# Patient Record
Sex: Female | Born: 1955 | Race: White | Hispanic: No | Marital: Married | Smoking: Never smoker
Health system: Southern US, Community
[De-identification: ages and names within clinical notes are randomized; demographics above are authoritative.]

## PROBLEM LIST (undated history)

## (undated) DIAGNOSIS — R87619 Unspecified abnormal cytological findings in specimens from cervix uteri: Secondary | ICD-10-CM

## (undated) DIAGNOSIS — D219 Benign neoplasm of connective and other soft tissue, unspecified: Secondary | ICD-10-CM

## (undated) DIAGNOSIS — F32A Depression, unspecified: Secondary | ICD-10-CM

## (undated) DIAGNOSIS — S5291XA Unspecified fracture of right forearm, initial encounter for closed fracture: Secondary | ICD-10-CM

## (undated) DIAGNOSIS — F419 Anxiety disorder, unspecified: Secondary | ICD-10-CM

## (undated) DIAGNOSIS — M8580 Other specified disorders of bone density and structure, unspecified site: Secondary | ICD-10-CM

## (undated) DIAGNOSIS — B029 Zoster without complications: Secondary | ICD-10-CM

## (undated) DIAGNOSIS — F329 Major depressive disorder, single episode, unspecified: Secondary | ICD-10-CM

## (undated) HISTORY — DX: Major depressive disorder, single episode, unspecified: F32.9

## (undated) HISTORY — DX: Benign neoplasm of connective and other soft tissue, unspecified: D21.9

## (undated) HISTORY — PX: ENDOMETRIAL BIOPSY: SHX622

## (undated) HISTORY — DX: Zoster without complications: B02.9

## (undated) HISTORY — DX: Other specified disorders of bone density and structure, unspecified site: M85.80

## (undated) HISTORY — DX: Unspecified abnormal cytological findings in specimens from cervix uteri: R87.619

## (undated) HISTORY — DX: Depression, unspecified: F32.A

## (undated) HISTORY — DX: Unspecified fracture of right forearm, initial encounter for closed fracture: S52.91XA

## (undated) HISTORY — DX: Anxiety disorder, unspecified: F41.9

---
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
## 1991-06-05 HISTORY — PX: TUBAL LIGATION: SHX77

## 2001-08-07 ENCOUNTER — Encounter: Payer: Self-pay | Admitting: Unknown Physician Specialty

## 2001-08-07 ENCOUNTER — Ambulatory Visit (HOSPITAL_COMMUNITY): Admission: RE | Admit: 2001-08-07 | Discharge: 2001-08-07 | Payer: Self-pay | Admitting: Unknown Physician Specialty

## 2002-08-20 ENCOUNTER — Encounter: Payer: Self-pay | Admitting: Unknown Physician Specialty

## 2002-08-20 ENCOUNTER — Ambulatory Visit (HOSPITAL_COMMUNITY): Admission: RE | Admit: 2002-08-20 | Discharge: 2002-08-20 | Payer: Self-pay | Admitting: Unknown Physician Specialty

## 2004-07-28 ENCOUNTER — Ambulatory Visit (HOSPITAL_COMMUNITY): Admission: RE | Admit: 2004-07-28 | Discharge: 2004-07-28 | Payer: Self-pay | Admitting: Unknown Physician Specialty

## 2006-07-11 ENCOUNTER — Ambulatory Visit (HOSPITAL_COMMUNITY): Admission: RE | Admit: 2006-07-11 | Discharge: 2006-07-11 | Payer: Self-pay | Admitting: Pediatrics

## 2007-07-10 ENCOUNTER — Other Ambulatory Visit: Admission: RE | Admit: 2007-07-10 | Discharge: 2007-07-10 | Payer: Self-pay | Admitting: Obstetrics and Gynecology

## 2008-07-16 ENCOUNTER — Other Ambulatory Visit: Admission: RE | Admit: 2008-07-16 | Discharge: 2008-07-16 | Payer: Self-pay | Admitting: Obstetrics and Gynecology

## 2008-08-04 ENCOUNTER — Ambulatory Visit (HOSPITAL_COMMUNITY): Admission: RE | Admit: 2008-08-04 | Discharge: 2008-08-04 | Payer: Self-pay | Admitting: Obstetrics and Gynecology

## 2008-10-11 LAB — HM DEXA SCAN

## 2010-08-25 ENCOUNTER — Other Ambulatory Visit: Payer: Self-pay | Admitting: Certified Nurse Midwife

## 2010-08-25 DIAGNOSIS — Z139 Encounter for screening, unspecified: Secondary | ICD-10-CM

## 2010-08-31 ENCOUNTER — Ambulatory Visit (HOSPITAL_COMMUNITY)
Admission: RE | Admit: 2010-08-31 | Discharge: 2010-08-31 | Disposition: A | Discharge status: Home / Self Care | Payer: BC Managed Care – PPO | Source: Ambulatory Visit | Attending: Certified Nurse Midwife | Admitting: Certified Nurse Midwife

## 2010-08-31 DIAGNOSIS — Z139 Encounter for screening, unspecified: Secondary | ICD-10-CM

## 2010-08-31 DIAGNOSIS — Z1231 Encounter for screening mammogram for malignant neoplasm of breast: Secondary | ICD-10-CM | POA: Insufficient documentation

## 2011-09-04 LAB — HM PAP SMEAR: HM Pap smear: NEGATIVE

## 2011-09-10 ENCOUNTER — Other Ambulatory Visit: Payer: Self-pay | Admitting: Certified Nurse Midwife

## 2011-09-10 DIAGNOSIS — Z139 Encounter for screening, unspecified: Secondary | ICD-10-CM

## 2011-09-13 ENCOUNTER — Ambulatory Visit (HOSPITAL_COMMUNITY): Payer: BC Managed Care – PPO

## 2011-09-17 ENCOUNTER — Ambulatory Visit (HOSPITAL_COMMUNITY)
Admission: RE | Admit: 2011-09-17 | Discharge: 2011-09-17 | Disposition: A | Discharge status: Home / Self Care | Payer: BC Managed Care – PPO | Source: Ambulatory Visit | Attending: Certified Nurse Midwife | Admitting: Certified Nurse Midwife

## 2011-09-17 DIAGNOSIS — Z1231 Encounter for screening mammogram for malignant neoplasm of breast: Secondary | ICD-10-CM | POA: Insufficient documentation

## 2011-09-17 DIAGNOSIS — Z139 Encounter for screening, unspecified: Secondary | ICD-10-CM

## 2012-09-24 ENCOUNTER — Encounter: Payer: Self-pay | Admitting: *Deleted

## 2012-09-25 ENCOUNTER — Ambulatory Visit: Payer: Self-pay | Admitting: Certified Nurse Midwife

## 2012-10-03 ENCOUNTER — Ambulatory Visit (INDEPENDENT_AMBULATORY_CARE_PROVIDER_SITE_OTHER): Payer: BC Managed Care – PPO | Admitting: Certified Nurse Midwife

## 2012-10-03 ENCOUNTER — Encounter: Payer: Self-pay | Admitting: Certified Nurse Midwife

## 2012-10-03 VITALS — BP 102/62 | Ht 63.0 in | Wt 114.0 lb

## 2012-10-03 DIAGNOSIS — Z01419 Encounter for gynecological examination (general) (routine) without abnormal findings: Secondary | ICD-10-CM

## 2012-10-03 DIAGNOSIS — Z0000 Encounter for general adult medical examination without abnormal findings: Secondary | ICD-10-CM

## 2012-10-03 LAB — POCT URINALYSIS DIPSTICK
Bilirubin, UA: NEGATIVE
Glucose, UA: NEGATIVE
Leukocytes, UA: NEGATIVE
Nitrite, UA: NEGATIVE

## 2012-10-03 NOTE — Progress Notes (Signed)
57 y.o. G3P3 Married Caucasian Fe here for annual exam.  Menopausal no vaginal bleeding. Had endometrial biopsy for PMB 4-13, benign.  Staying active, had labs with PCP , all normal except cholesterol borderline high, working on with diet and exercise.  Occasional vaginal dryness with sexual activity using OTC lubricant.  No health issues today.   Patient's last menstrual period was 09/19/2011.          Sexually active: yes  The current method of family planning is tubal ligation.    Exercising: yes  walking, weights, crunches Smoker:  no  Health Maintenance: Pap:  09-04-11 neg pap MMG:  09-17-11 neg Colonoscopy:  3/11 BMD: 15 yrs ago, 5/10 heel scan TDaP:  2004 Labs: Poct urine-neg Self breast exam: occ   reports that she has never smoked. She does not have any smokeless tobacco history on file. She reports that she does not drink alcohol or use illicit drugs.  Past Medical History  Diagnosis Date  . Fibroid     Past Surgical History  Procedure Laterality Date  . Tubal ligation  1993    BTL  . Cesarean section  1993  . Tonsillectomy  1963    Current Outpatient Prescriptions  Medication Sig Dispense Refill  . aspirin 81 MG tablet Take 81 mg by mouth daily.      . Cholecalciferol (VITAMIN D PO) Take 50,000 Units by mouth every 14 (fourteen) days.      . Red Yeast Rice Extract (RED YEAST RICE PO) Take 1,200 mg by mouth daily.       No current facility-administered medications for this visit.    History reviewed. No pertinent family history.  ROS:  Pertinent items are noted in HPI.  Otherwise, a comprehensive ROS was negative.  Exam:   BP 102/62  Ht 5\' 3"  (1.6 m)  Wt 114 lb (51.71 kg)  BMI 20.2 kg/m2  LMP 09/19/2011 Height: 5\' 3"  (160 cm)  Ht Readings from Last 3 Encounters:  10/03/12 5\' 3"  (1.6 m)    General appearance: alert, cooperative and appears stated age Head: Normocephalic, without obvious abnormality, atraumatic Neck: no adenopathy, supple, symmetrical,  trachea midline and thyroid normal to inspection and palpation Lungs: clear to auscultation bilaterally Breasts: normal appearance, no masses or tenderness, No nipple discharge or bleeding Heart: regular rate and rhythm Abdomen: soft, non-tender; no masses,  no organomegaly Extremities: extremities normal, atraumatic, no cyanosis or edema Skin: Skin color, texture, turgor normal. No rashes or lesions Lymph nodes: Cervical, supraclavicular, and axillary nodes normal. No abnormal inguinal nodes palpated Neurologic: Grossly normal   Pelvic: External genitalia:  no lesions              Urethra:  normal appearing urethra with no masses, tenderness or lesions              Bartholin's and Skene's: normal                 Vagina: normal appearing vagina with normal color and discharge, no lesions              Cervix: normal, non tender              Pap taken: yes HPVHR Bimanual Exam:  Uterus:  normal size, contour, position, consistency, mobility, non-tender and anteverted              Adnexa: normal adnexa and no mass, fullness, tenderness  Rectovaginal: Confirms               Anus:  normal sphincter tone, no lesions  A:  Well Woman with normal exam  Menopausal, no HRT  Vaginal Dryness using OTC moisture product   P:   Pap smear as per guidelines   Mammogram yearly  Advise if problems with vaginal dryness or has any  Vaginal bleeding.  RV annually, prn          An After Visit Summary was printed and given to the patient.  Reviewed, TL

## 2012-10-03 NOTE — Patient Instructions (Signed)

## 2012-10-06 ENCOUNTER — Other Ambulatory Visit: Payer: Self-pay | Admitting: Certified Nurse Midwife

## 2012-10-06 DIAGNOSIS — Z139 Encounter for screening, unspecified: Secondary | ICD-10-CM

## 2012-10-07 LAB — IPS PAP TEST WITH HPV

## 2012-10-09 ENCOUNTER — Ambulatory Visit (HOSPITAL_COMMUNITY): Payer: BC Managed Care – PPO

## 2012-10-13 ENCOUNTER — Ambulatory Visit (HOSPITAL_COMMUNITY)
Admission: RE | Admit: 2012-10-13 | Discharge: 2012-10-13 | Disposition: A | Discharge status: Home / Self Care | Payer: BC Managed Care – PPO | Source: Ambulatory Visit | Attending: Certified Nurse Midwife | Admitting: Certified Nurse Midwife

## 2012-10-13 DIAGNOSIS — Z139 Encounter for screening, unspecified: Secondary | ICD-10-CM

## 2012-10-13 DIAGNOSIS — Z1231 Encounter for screening mammogram for malignant neoplasm of breast: Secondary | ICD-10-CM | POA: Insufficient documentation

## 2012-10-22 ENCOUNTER — Telehealth: Payer: Self-pay | Admitting: Certified Nurse Midwife

## 2012-10-22 NOTE — Telephone Encounter (Signed)
Patient was told to get Vitamin D by Ander Slade, and wants to speak specifically with Joy in reference to further information regarding intake.

## 2012-10-22 NOTE — Telephone Encounter (Signed)
Patient couldn't remember whether dl told her to take vit d2 1000 or d3 1000. Patient has vitamin d2. Pt will take that daily after she finishes the vitamin d 50,000iu 1 time a month  Routed to dl

## 2012-10-22 NOTE — Telephone Encounter (Signed)
Pt stated depending on what dosage she finds will determine whether it will be d2 or d3. She already bought the d2

## 2012-10-22 NOTE — Telephone Encounter (Signed)
I dont see where vitamin d labs where done so not sure what it is in reference to. Left message for patient to callback

## 2012-10-22 NOTE — Telephone Encounter (Signed)
Patient returning Joy's call.  °

## 2012-10-22 NOTE — Telephone Encounter (Signed)
Should use D3

## 2012-12-25 ENCOUNTER — Telehealth: Payer: Self-pay | Admitting: Certified Nurse Midwife

## 2012-12-25 NOTE — Telephone Encounter (Signed)
Patient is having some "strange health concerns". Patient would like an appointment with Lovett Sox.

## 2012-12-25 NOTE — Telephone Encounter (Signed)
Last AEX 10/06/2012 with Ortencia Kick. Patient calling today with concerns of medication she has taken for peripheral neuropathy x 2-3 days that caused weakness, nausea symptoms.  States she took L-Carnitine 1000mg  2 x day and B complex ,B50, 3 x day states this was to help with peripheral neuropathy. Saw her PCP yesterday for the symptoms and when he looked up the L-Carnitine stated those were side effects to it. Due to this it has caused her panic and fear and anxiety. Stated he Rx xanax 5mg  to take. States for short term use. She took only 0.25mg  this morning and when she call she has not felt it has helped. Calling to ask if she could use Citalopram she had used one time before for anxiety. Patient aware D. Darcel Bayley out of town and state would wait to hear back from her. appointment given to patient with D. Leonard,CNM, for wed. July 30th @ 10:30am

## 2012-12-31 ENCOUNTER — Ambulatory Visit (INDEPENDENT_AMBULATORY_CARE_PROVIDER_SITE_OTHER): Payer: BC Managed Care – PPO | Admitting: Certified Nurse Midwife

## 2012-12-31 ENCOUNTER — Encounter: Payer: Self-pay | Admitting: Certified Nurse Midwife

## 2012-12-31 VITALS — BP 108/60 | HR 60 | Resp 16 | Ht 63.0 in | Wt 110.0 lb

## 2012-12-31 DIAGNOSIS — F329 Major depressive disorder, single episode, unspecified: Secondary | ICD-10-CM

## 2012-12-31 DIAGNOSIS — F411 Generalized anxiety disorder: Secondary | ICD-10-CM

## 2012-12-31 MED ORDER — ESCITALOPRAM OXALATE 10 MG PO TABS: 10.0000 mg | ORAL_TABLET | Freq: Every day | ORAL | Status: DC

## 2012-12-31 NOTE — Progress Notes (Signed)
57 y.o.MarriedCaucasianfemale presents with symptoms of anxiety, depression.  Pt reports symptoms have been for 8 of weeks.  The symptoms have moderately affected her activities of daily living.  She reports restless sleep, worry of chronic pain with neuropathy in feet(undiagnosed, has an appointment with foot specialist), depression of not being able to go on with her usual routine. Some crying episodes, no thoughts of self harm or others. Family concerned and with her today(daughter, sister in law).Patient had tried OTC product recommended to her and had increase change in her feet. Patient saw PCP for lab work all normal. Patient given Xanax for panic attacks, but doesn't want to use. Took 2 (.25 mg) and noted some change in anxious feeling.  Has been on anti anxiety medication Celexa, but didn't like how she felt, so she stopped.  Patient would like advice concerning plan for her to "feel better" . Patient 3 years out from menopause, no hot flashes or night sweats. Eats well.   ROS: Pertinent items are noted in HPI.  Physical exam:  General appearance: alert, cooperative and appears stated age, Healthy WDWN Affect: anxious, orientation X 3  A:Anxiety/depression exacerbated by feet concern 2-Tingling of feet, no exam or work up, has appointment for.   Plan:  Discussed with patient the pros and cons of medication use on daily basis vs interim with Xanax. Patient would like trial.  Rx Lexapro 10 mg see order, instructions given Only use Xanax for panic attack only. Patient agreeable. Discussed counseling would also help with coping with changes she faces daily. Given information on. Patient will consider. Seek ER or 911 if thoughts of self harm or others. 2-Keep appointment to validate problem or not, so she can deal with and move on.  Agreeable.  Rv 1 week after onset of medication use.     35 minutes spent with patient with >50% of time spent in face to face counseling.

## 2013-01-01 DIAGNOSIS — F411 Generalized anxiety disorder: Secondary | ICD-10-CM | POA: Insufficient documentation

## 2013-01-01 NOTE — Progress Notes (Signed)
Note reviewed, agree with plan.  Annamaria Salah, MD  

## 2013-01-09 ENCOUNTER — Encounter: Payer: Self-pay | Admitting: Certified Nurse Midwife

## 2013-01-09 ENCOUNTER — Ambulatory Visit: Payer: BC Managed Care – PPO | Admitting: Certified Nurse Midwife

## 2013-01-09 ENCOUNTER — Ambulatory Visit (INDEPENDENT_AMBULATORY_CARE_PROVIDER_SITE_OTHER): Payer: BC Managed Care – PPO | Admitting: Certified Nurse Midwife

## 2013-01-09 VITALS — BP 104/60 | HR 60 | Resp 16 | Ht 63.0 in | Wt 111.0 lb

## 2013-01-09 DIAGNOSIS — F411 Generalized anxiety disorder: Secondary | ICD-10-CM

## 2013-01-09 NOTE — Progress Notes (Signed)
57 y.o.MarriedCaucasianfemaleG3P3here for follow-up of anxiety being treated with  Lexapro 10 mg.   Initiated July 30/14.  Patient taking medication as instructed AM.  Denies nausea, headache or other medication side effects.   Reports no crying. Has had only one panic attack at onset of medication. Still having some insomnia and fatigue, but sleeping 4 hours per night now. Denies thoughts of self harm or others.  Feelings of" dread"  are much better.      Has not seen counselor yet, but has scheduled appointment for feet and leg evaluation. Plans to call Berniece Andreas to schedule appointment. Daughter with patient  O:Healthy WD,WN female, appropriately dressed   Affect : Appropriate and  orientation x 3    A:1-Anxiety responding to Lexapro  2- No counseling in progress yet but plans to schedule  P:1- Continue medication as prescribed 2- Has Rx Lexapro 3-RV 2 1/2 weeks for re-evaluation 4-Instructed if thoughts of self harm or others seek immediate help 911 or emergency room.  Questions addressed.      Rv as above.    35 minutes spent with patient with >50% of time spent in face to face counseling.

## 2013-01-10 NOTE — Progress Notes (Signed)
Note reviewed, agree with plan.  Abdikadir Fohl, MD  

## 2013-01-12 ENCOUNTER — Ambulatory Visit: Payer: BC Managed Care – PPO | Admitting: Certified Nurse Midwife

## 2013-01-12 ENCOUNTER — Ambulatory Visit (INDEPENDENT_AMBULATORY_CARE_PROVIDER_SITE_OTHER): Payer: BC Managed Care – PPO | Admitting: Licensed Clinical Social Worker

## 2013-01-12 DIAGNOSIS — F321 Major depressive disorder, single episode, moderate: Secondary | ICD-10-CM

## 2013-01-22 ENCOUNTER — Other Ambulatory Visit: Payer: Self-pay | Admitting: Neurology

## 2013-01-22 ENCOUNTER — Ambulatory Visit (INDEPENDENT_AMBULATORY_CARE_PROVIDER_SITE_OTHER): Payer: BC Managed Care – PPO | Admitting: Neurology

## 2013-01-22 ENCOUNTER — Encounter: Payer: Self-pay | Admitting: Neurology

## 2013-01-22 VITALS — BP 109/66 | HR 72 | Ht 63.0 in | Wt 113.0 lb

## 2013-01-22 DIAGNOSIS — Z820 Family history of epilepsy and other diseases of the nervous system: Secondary | ICD-10-CM

## 2013-01-22 DIAGNOSIS — R202 Paresthesia of skin: Secondary | ICD-10-CM

## 2013-01-22 DIAGNOSIS — R200 Anesthesia of skin: Secondary | ICD-10-CM

## 2013-01-22 DIAGNOSIS — R209 Unspecified disturbances of skin sensation: Secondary | ICD-10-CM

## 2013-01-22 DIAGNOSIS — Z8269 Family history of other diseases of the musculoskeletal system and connective tissue: Secondary | ICD-10-CM

## 2013-01-22 DIAGNOSIS — B029 Zoster without complications: Secondary | ICD-10-CM

## 2013-01-22 DIAGNOSIS — F411 Generalized anxiety disorder: Secondary | ICD-10-CM

## 2013-01-22 NOTE — Progress Notes (Signed)
Subjective:    Patient ID: Morgan Harrison is a 57 y.o. female.  HPI  Huston Foley, MD, PhD [facility] [address], [address] [address] [place], [place] [address]  Dear Dr. Margo Aye,   I saw your patient, Morgan Harrison, upon your kind request in my neurologic clinic today for initial consultation of her foot pain and tingling. The patient is accompanied by her daughter today. As you know, Morgan Harrison is a very pleasant 57 year old right-handed woman with an underlying medical history of depression, anxiety, shingles, and vitamin D deficiency, who has been experiencing bilateral foot tingling and numbness since 2008. She was on Lamisil at the time and stopped the medication after being on it for 3 weeks and symptoms improved gradually. She was on red yeast rice at the time and stopped it as well. She restarted the red yeast rice again this summer and had similar Sx. She started having a herpetic rash some 9 days ago on the R lateral neck area, healing. And she took L-carnitine for a while and stopped it as well. She also took a B complex, which she stopped as well. She is not diabetic. She has not noted any weakness, no significant pain.  She was started on lexapro for anxiety some 3 weeks ago and symptoms have gradually improved. She has a FHx of neuropathy in her father, who is not very debilitated from it. She had some blood work on 12/24/2012 in your office which included a normal TSH, normal CMP, normal CBC, normal ESR, normal TSH and low normal vitamin D. I reviewed those results with her and her daughter today.  Her Past Medical History Is Significant For: Past Medical History  Diagnosis Date  . Fibroid   . Anxiety and depression   . Shingles     Her Past Surgical History Is Significant For: Past Surgical History  Procedure Laterality Date  . Tubal ligation  1993    BTL  . Cesarean section  1993  . Tonsillectomy  1963    Her Family History Is Significant  For: Family History  Problem Relation Age of Onset  . Neuropathy Father     idiopathic    Her Social History Is Significant For: History   Social History  . Marital Status: Married    Spouse Name: Morgan Harrison    Number of Children: 3  . Years of Education: MA   Occupational History  .      [employer]   Social History Main Topics  . Smoking status: Never Smoker   . Smokeless tobacco: Never Used  . Alcohol Use: No  . Drug Use: No  . Sexual Activity: Yes    Partners: Male    Birth Control/ Protection: Surgical     Comment: btl   Other Topics Concern  . None   Social History Narrative   Patient lives at home with her spouse.   Caffeine Use: 1 cup daily    Her Allergies Are:  No Known Allergies:   Her Current Medications Are:  Outpatient Encounter Prescriptions as of 01/22/2013  Medication Sig Dispense Refill  . ALPRAZolam (XANAX) 0.5 MG tablet Takes 1/2      . aspirin 81 MG tablet Take 81 mg by mouth daily.      . Cholecalciferol (VITAMIN D PO) Take 50,000 Units by mouth every 30 (thirty) days.       Marland Kitchen escitalopram (LEXAPRO) 10 MG tablet Take 1 tablet (10 mg total) by mouth daily.  30 tablet  0  . valACYclovir (VALTREX) 1000 MG tablet        No facility-administered encounter medications on file as of 01/22/2013.   Review of Systems  Constitutional: Positive for fatigue.   Objective:  Neurologic Exam  Physical Exam Physical Examination:   Filed Vitals:   01/22/13 1427  BP: 109/66  Pulse: 72    General Examination: The patient is a very pleasant 57 y.o. female in no acute distress. She appears well-developed and well-nourished and well groomed. She is mildly anxious appearing and she is mildly tearful at one point.  HEENT: Normocephalic, atraumatic, pupils are equal, round and reactive to light and accommodation. Funduscopic exam is normal with sharp disc margins noted. Extraocular tracking is good without limitation to gaze excursion or nystagmus noted.  Normal smooth pursuit is noted. Hearing is grossly intact. Tympanic membranes are clear bilaterally. Face is symmetric with normal facial animation and normal facial sensation. Speech is clear with no dysarthria noted. There is no hypophonia. There is no lip, neck/head, jaw or voice tremor. Neck is supple with full range of passive and active motion. There are no carotid bruits on auscultation. Oropharynx exam reveals: mild mouth dryness, good dental hygiene and mild airway crowding, due to elongated tongue. Mallampati is class II. Tongue protrudes centrally and palate elevates symmetrically.    Chest: Clear to auscultation without wheezing, rhonchi or crackles noted.  Heart: S1+S2+0, regular and normal without murmurs, rubs or gallops noted.   Abdomen: Soft, non-tender and non-distended with normal bowel sounds appreciated on auscultation.  Extremities: There is no pitting edema in the distal lower extremities bilaterally. Pedal pulses are intact.  Skin: Warm and dry without trophic changes noted. There are no varicose veins. She has a slightly erythematous rash along the side override the lateral neck. She has no actual blisters. No discharge is seen.  Musculoskeletal: exam reveals no obvious joint deformities, tenderness or joint swelling or erythema.   Neurologically:  Mental status: The patient is awake, alert and oriented in all 4 spheres. Her memory, attention, language and knowledge are appropriate. There is no aphasia, agnosia, apraxia or anomia. Speech is clear with normal prosody and enunciation. Thought process is linear. Mood is congruent and affect is normal.  Cranial nerves are as described above under HEENT exam. In addition, shoulder shrug is normal with equal shoulder height noted. Motor exam: Normal bulk, strength and tone is noted. There is no drift, tremor or rebound. Romberg is negative. Reflexes are 2+ throughout. Toes are downgoing bilaterally. Fine motor skills are intact  with normal finger taps, normal hand movements, normal rapid alternating patting, normal foot taps and normal foot agility.  Cerebellar testing shows no dysmetria or intention tremor on finger to nose testing. Heel to shin is unremarkable bilaterally. There is no truncal or gait ataxia.  Sensory exam is intact to light touch, pinprick, vibration, temperature sense and proprioception in the upper and lower extremities.  Gait, station and balance are unremarkable. No veering to one side is noted. No leaning to one side is noted. Posture is age-appropriate and stance is narrow based. No problems turning are noted. She turns en bloc. Tandem walk is unremarkable. Intact toe and heel stance is noted.               Assessment and Plan:   In summary, Morgan Harrison is a very pleasant 57 y.o.-year old female with a history of paresthesias and subjective numbness. Her physical and neurological exam are  nonfocal at this time and I reassured the patient and her daughter in that regard. Nevertheless given her family history of neuropathy and her family history of autoimmune disorders I would like to proceed with more blood work. I really do not see the need for doing EMG and nerve conduction studies at this very moment. She really does not have much in the way of objective findings at this time. I will screen her for additional neuropathy etiology via blood work and we will call her back with her blood test results. If all of these are normal I can see her back on an as-needed basis. She does appear to be an anxious person and as you know anxiety sometimes does manifest with paresthesias. Thankfully she has had some improvement in her symptoms with Lexapro and I explained to her that it may take up to 6 weeks to really kick in. It has been about 3 weeks and she started it and she is tolerating it well. She will most likely be able to followup with you and see me on an as-needed basis. She and her daughter were in  agreement.  Thank you very much for allowing me to participate in the care of this nice patient. If I can be of any further assistance to you please do not hesitate to call me at [phone].  Sincerely,   Huston Foley, MD, PhD

## 2013-01-22 NOTE — Patient Instructions (Addendum)
I think overall you are doing fairly well and are stable at this point.   I do have some generic suggestions for you today:   Please make sure that you drink plenty of fluids. I would like for you to exercise daily for example in the form of walking 20-30 minutes every day, if you can. Please keep a regular sleep-wake schedule, keep regular meal times, do not skip any meals, eat  healthy snacks in between meals, such as fruit or nuts. Try to eat protein with every meal.   As far as your medications are concerned, I would like to suggest: no new medications   As far as diagnostic testing, I recommend: some blood work today.   We will call you with your test results and if they are all normal, I can see you back as needed.   Brett Canales is my clinical assistant and will answer any of your questions and relay your messages to me and will give you my messages.   Our phone number is [phone]. We also have an after hours call service for urgent matters and there is a physician on-call for urgent questions. For any emergencies you know to call 911 or go to the nearest emergency room.

## 2013-01-23 ENCOUNTER — Ambulatory Visit (INDEPENDENT_AMBULATORY_CARE_PROVIDER_SITE_OTHER): Payer: BC Managed Care – PPO | Admitting: Licensed Clinical Social Worker

## 2013-01-23 DIAGNOSIS — F321 Major depressive disorder, single episode, moderate: Secondary | ICD-10-CM

## 2013-01-26 ENCOUNTER — Telehealth: Payer: Self-pay | Admitting: Certified Nurse Midwife

## 2013-01-26 DIAGNOSIS — F411 Generalized anxiety disorder: Secondary | ICD-10-CM

## 2013-01-26 NOTE — Telephone Encounter (Signed)
Please advise Ms. Morgan Harrison #30/0 rf's was sent 12/31/12 patient has appointment for recheck 01/30/13 will run out before then.

## 2013-01-26 NOTE — Telephone Encounter (Signed)
Patient needs her Lexapro (gen) refilled . She has an appointment on Friday but will be out before then.

## 2013-01-28 LAB — IFE AND PE, SERUM
Alpha 1: 0.2 g/dL (ref 0.1–0.4)
Alpha2 Glob SerPl Elph-Mcnc: 0.7 g/dL (ref 0.4–1.2)
B-Globulin SerPl Elph-Mcnc: 0.9 g/dL (ref 0.6–1.3)
Gamma Glob SerPl Elph-Mcnc: 0.8 g/dL (ref 0.5–1.6)
Globulin, Total: 2.6 g/dL (ref 2.0–4.5)

## 2013-01-28 LAB — COPPER, SERUM: Copper: 108 ug/dL (ref 72–166)

## 2013-01-28 LAB — VITAMIN B1: Vitamin B1 (Thiamine): 24.1 nmol/L (ref 8.1–32.9)

## 2013-01-28 LAB — ANA W/REFLEX: Anti Nuclear Antibody(ANA): NEGATIVE

## 2013-01-28 LAB — CERULOPLASMIN: Ceruloplasmin: 24.9 mg/dL (ref 16.0–45.0)

## 2013-01-28 LAB — HGB A1C W/O EAG: Hgb A1c MFr Bld: 6 % — ABNORMAL HIGH (ref 4.8–5.6)

## 2013-01-28 LAB — AMMONIA: Ammonia: 61 ug/dL (ref 19–87)

## 2013-01-28 LAB — C-REACTIVE PROTEIN: CRP: 2.9 mg/L (ref 0.0–4.9)

## 2013-01-28 LAB — RPR: RPR: NONREACTIVE

## 2013-01-28 MED ORDER — ESCITALOPRAM OXALATE 10 MG PO TABS: 10.0000 mg | ORAL_TABLET | Freq: Every day | ORAL | Status: DC

## 2013-01-28 NOTE — Telephone Encounter (Signed)
Lexapro 10 mg #30/0rf's sent through to LM on patient's VM of rf being sent.

## 2013-01-28 NOTE — Telephone Encounter (Signed)
Ok to refill # 30 no refill

## 2013-01-29 ENCOUNTER — Encounter: Payer: Self-pay | Admitting: Neurology

## 2013-01-29 ENCOUNTER — Ambulatory Visit (INDEPENDENT_AMBULATORY_CARE_PROVIDER_SITE_OTHER): Payer: BC Managed Care – PPO | Admitting: Certified Nurse Midwife

## 2013-01-29 ENCOUNTER — Encounter: Payer: Self-pay | Admitting: Certified Nurse Midwife

## 2013-01-29 VITALS — BP 102/60 | HR 68 | Resp 16 | Ht 63.25 in | Wt 111.0 lb

## 2013-01-29 DIAGNOSIS — F411 Generalized anxiety disorder: Secondary | ICD-10-CM

## 2013-01-29 NOTE — Progress Notes (Signed)
57 y.o. Married Caucasian female G3P3 here for follow up of anxiety treated with Lexapro initiated on 01/01/13. Patient denies any nausea or headaches with medication. Taking medication every am as directed. Patient hs had two visits with Darnelle Bos counselor and feel it is going well. Daughter with patient and feels she is much better. Saw the neurologist and does not peripheral neuropathy. Currently undergoing evaluation for sporadic numbness of feet. Fatigue much better!  Back at work now.  Was treated for mild case of shingles, responded well to Valtrex. Patient feels status much better, no panic attacks or moments of dread.  Next appointment with counselor one week.  Denies any symptoms of self or others harm.   O: Healthy WD,WN female Affect: Normal, smiling dressed appropriately   A:Anxiety responding well to Lexapro, desires continuance Counseling on going. Evaluation for feet numbness in process. Shingles being treated.   P: Discussed findings of Lexapro appropriate drug for her and encourage continuation. Has Rx with refill. Keep appointments with counselor and neurologist as scheduled. Follow up with shingles as indicated   RV 2 months, prn  20 minutes spent with patient with >50% of time spent in face to face counseling.

## 2013-01-29 NOTE — Progress Notes (Signed)
Quick Note:  Please call patient and advise her that her blood work was fine with the exception of a borderline diabetes marker at 6. This is called hemoglobin A1c. This does not mean that she has full-blown diabetes but puts her at risk for diabetes. Weight loss and talking with her primary care physician about diabetes risk and management is advised at this time. ______

## 2013-01-30 ENCOUNTER — Telehealth: Payer: Self-pay | Admitting: Neurology

## 2013-01-30 NOTE — Telephone Encounter (Signed)
I called and spoke with patient. She stated that someone called and gave her results. I let her know it appears there are still some results coming in. We will call her by next Thursday. Patient stated she will call back if we do not. I asked her to ask for me.

## 2013-01-30 NOTE — Progress Notes (Signed)
Quick Note:  Spoke with patient and relayed results of blood work. The patient was also reminded of any future appointments. Patient understood and had no questions.  ______ 

## 2013-01-31 MED ORDER — ESCITALOPRAM OXALATE 10 MG PO TABS: 10.0000 mg | ORAL_TABLET | Freq: Every day | ORAL | Status: DC

## 2013-02-01 NOTE — Progress Notes (Signed)
Note reviewed, agree with plan.  Jonaya Freshour, MD  

## 2013-02-03 ENCOUNTER — Encounter: Payer: Self-pay | Admitting: Certified Nurse Midwife

## 2013-02-13 ENCOUNTER — Ambulatory Visit (INDEPENDENT_AMBULATORY_CARE_PROVIDER_SITE_OTHER): Payer: BC Managed Care – PPO | Admitting: Licensed Clinical Social Worker

## 2013-02-13 DIAGNOSIS — F321 Major depressive disorder, single episode, moderate: Secondary | ICD-10-CM

## 2013-04-02 ENCOUNTER — Encounter: Payer: Self-pay | Admitting: Certified Nurse Midwife

## 2013-04-02 ENCOUNTER — Ambulatory Visit (INDEPENDENT_AMBULATORY_CARE_PROVIDER_SITE_OTHER): Payer: BC Managed Care – PPO | Admitting: Certified Nurse Midwife

## 2013-04-02 VITALS — BP 100/60 | HR 78 | Resp 16 | Ht 63.25 in | Wt 109.0 lb

## 2013-04-02 DIAGNOSIS — F411 Generalized anxiety disorder: Secondary | ICD-10-CM

## 2013-04-02 MED ORDER — ESCITALOPRAM OXALATE 10 MG PO TABS: 10.0000 mg | ORAL_TABLET | Freq: Every day | ORAL | Status: DC

## 2013-04-02 NOTE — Progress Notes (Signed)
57 y.o.MarriedCaucasianfemaleG3P3here for follow-up of anxiety and depression being treated with  Lexapro 10 mg   Initiated  01/01/13.  Patient taking medication as instructed in am..  Denies nausea, headache or other medication side effects. Reports no crying, panic attacks, insomnia,  fatigue, or  thoughts of self harm or others.  Feelings of anxiety of depression seem so much better. " I feel like Vaughn now" Patient does want to continue medication. My spouse now is seeing counselor too and will continue as I feel I need to. No other health issues now. Seeing PCP for borderline Hgb A1c. Has now changed diet and feels so much better.     Seeing Berniece Andreas Counselor prn now, was released for prn visits per patient.      O:Healthy WD,WN female, appropriately dressed    Weight:109lbs Affect : Appropriate    A:1-Anxiety responding to Lexapro 2-Counseling in progress   P:1- Continue medication as prescribed 2-RX Lexapro see order 3-RV 2 months  4-Instructed if thoughts of self harm or others seek immediate help 911 or emergency room.  Questions addressed.                      35 minutes spent with patient with >50% of time spent in face to face counseling.

## 2013-04-03 NOTE — Progress Notes (Signed)
Note reviewed, agree with plan.  Lind Ausley, MD  

## 2013-04-09 ENCOUNTER — Other Ambulatory Visit: Payer: Self-pay

## 2013-06-17 ENCOUNTER — Ambulatory Visit (INDEPENDENT_AMBULATORY_CARE_PROVIDER_SITE_OTHER): Payer: BC Managed Care – PPO | Admitting: Certified Nurse Midwife

## 2013-06-17 VITALS — BP 98/60 | HR 64 | Resp 16 | Ht 63.25 in | Wt 110.0 lb

## 2013-06-17 DIAGNOSIS — F411 Generalized anxiety disorder: Secondary | ICD-10-CM

## 2013-06-17 NOTE — Progress Notes (Signed)
58 y.o.Married Caucasian female G3P3 here for follow-up of anxiety disorder being treated with  Lexapro 10mg   Initiated 12/14/12.  Patient taking medication as instructed AM.  Denies nausea, headache or other medication side effects.  Reports no crying,  panic attacks,  Insomnia, fatigue or thoughts of self harm or others.  Feelings of anxiety have greatly improved, to the point of " I feel like myself again and that I am on no medication".  Seeing Marya Amsler counselor every 3 weeks. Next visit one month. Feet pain has diminished greatly now!   O:Healthy WD,WN female, appropriately dressed    Weight:110 stable Affect : Appropriate and smiling with talking now    A:1-Anxiety responding to Lexapro well stable at this point 2-Counseling in progress   P:1- Continue medication as prescribed 2-RX Lexapro see order 3-RV  aex ( 54months) 4-Instructed if thoughts of self harm or others seek immediate help 911 or emergency room.  Questions addressed at length about multivitamin use,continuig Vitamin D OTC and exercise importance.            Rv prn, as above   40 minutes spent with patient with >50% of time spent in face to face counseling.

## 2013-06-18 ENCOUNTER — Encounter: Payer: Self-pay | Admitting: Certified Nurse Midwife

## 2013-06-19 ENCOUNTER — Encounter: Payer: Self-pay | Admitting: Certified Nurse Midwife

## 2013-06-19 NOTE — Progress Notes (Signed)
Reviewed personally.  M. Suzanne Chales Pelissier, MD.  

## 2013-07-29 ENCOUNTER — Other Ambulatory Visit: Payer: Self-pay | Admitting: Certified Nurse Midwife

## 2013-07-29 NOTE — Telephone Encounter (Signed)
aex is 10/05/13

## 2013-09-15 ENCOUNTER — Other Ambulatory Visit: Payer: Self-pay | Admitting: Certified Nurse Midwife

## 2013-09-15 DIAGNOSIS — Z139 Encounter for screening, unspecified: Secondary | ICD-10-CM

## 2013-10-05 ENCOUNTER — Ambulatory Visit (INDEPENDENT_AMBULATORY_CARE_PROVIDER_SITE_OTHER): Payer: BC Managed Care – PPO | Admitting: Certified Nurse Midwife

## 2013-10-05 ENCOUNTER — Encounter: Payer: Self-pay | Admitting: Certified Nurse Midwife

## 2013-10-05 VITALS — BP 98/60 | HR 64 | Resp 16 | Ht 62.75 in | Wt 110.0 lb

## 2013-10-05 DIAGNOSIS — Z0000 Encounter for general adult medical examination without abnormal findings: Secondary | ICD-10-CM

## 2013-10-05 DIAGNOSIS — Z01419 Encounter for gynecological examination (general) (routine) without abnormal findings: Secondary | ICD-10-CM

## 2013-10-05 DIAGNOSIS — Z124 Encounter for screening for malignant neoplasm of cervix: Secondary | ICD-10-CM

## 2013-10-05 LAB — POCT URINALYSIS DIPSTICK
BILIRUBIN UA: NEGATIVE
GLUCOSE UA: NEGATIVE
Ketones, UA: NEGATIVE
Leukocytes, UA: NEGATIVE
Nitrite, UA: NEGATIVE
PH UA: 5
Protein, UA: NEGATIVE
RBC UA: NEGATIVE
UROBILINOGEN UA: NEGATIVE

## 2013-10-05 MED ORDER — ESCITALOPRAM OXALATE 10 MG PO TABS: 10.0000 mg | ORAL_TABLET | Freq: Every day | ORAL | Status: DC

## 2013-10-05 NOTE — Progress Notes (Signed)
58 y.o. G3P3 Married Caucasian Fe here for annual exam. Menopausal no vaginal dryness or vaginal bleeding. Anxiety controlled with Lexapro without problems. Desires continuance of Lexapro, feels so much better since onset of use. No more panic attacks. Sees PCP for aex and labs. No other health issues today. Daughter getting married in 2 weeks!  Patient's last menstrual period was 09/19/2011.          Sexually active: yes  The current method of family planning is tubal ligation.    Exercising: yes  walking Smoker:  no  Health Maintenance: Pap:  09-04-11 neg MMG:  10-13-12 normal Colonoscopy:  3/11 BMD:   5/10 heel scan TDaP: 2004 Labs: Poct urine-neg Self breast exam: done occ   reports that she has never smoked. She has never used smokeless tobacco. She reports that she does not drink alcohol or use illicit drugs.  Past Medical History  Diagnosis Date  . Fibroid   . Anxiety and depression   . Shingles     Past Surgical History  Procedure Laterality Date  . Tubal ligation  1993    BTL  . Cesarean section  1993  . Tonsillectomy  1963    Current Outpatient Prescriptions  Medication Sig Dispense Refill  . aspirin 81 MG tablet Take 81 mg by mouth daily.      . cetirizine (ZYRTEC) 10 MG tablet Take 10 mg by mouth as needed for allergies.      . Cholecalciferol (VITAMIN D PO) Take by mouth daily.      Marland Kitchen escitalopram (LEXAPRO) 10 MG tablet Take 1 tablet (10 mg total) by mouth at bedtime.  30 tablet  6  . fluticasone (FLONASE) 50 MCG/ACT nasal spray as needed.      . Probiotic Product (PROBIOTIC PO) Take by mouth daily.      . valACYclovir (VALTREX) 1000 MG tablet as needed.        No current facility-administered medications for this visit.    Family History  Problem Relation Age of Onset  . Neuropathy Father     idiopathic    ROS:  Pertinent items are noted in HPI.  Otherwise, a comprehensive ROS was negative.  Exam:   BP 98/60  Pulse 64  Resp 16  Ht 5' 2.75" (1.594  m)  Wt 110 lb (49.896 kg)  BMI 19.64 kg/m2  LMP 09/19/2011 Height: 5' 2.75" (159.4 cm)  Ht Readings from Last 3 Encounters:  10/05/13 5' 2.75" (1.594 m)  06/17/13 5' 3.25" (1.607 m)  04/02/13 5' 3.25" (1.607 m)    General appearance: alert, cooperative and appears stated age Head: Normocephalic, without obvious abnormality, atraumatic Neck: no adenopathy, supple, symmetrical, trachea midline and thyroid normal to inspection and palpation and non-palpable Lungs: clear to auscultation bilaterally Breasts: normal appearance, no masses or tenderness, No nipple retraction or dimpling, No nipple discharge or bleeding, No axillary or supraclavicular adenopathy Heart: regular rate and rhythm Abdomen: soft, non-tender; no masses,  no organomegaly Extremities: extremities normal, atraumatic, no cyanosis or edema Skin: Skin color, texture, turgor normal. No rashes or lesions Lymph nodes: Cervical, supraclavicular, and axillary nodes normal. No abnormal inguinal nodes palpated Neurologic: Grossly normal   Pelvic: External genitalia:  no lesions              Urethra:  normal appearing urethra with no masses, tenderness or lesions              Bartholin's and Skene's: normal  Vagina: normal appearing vagina with normal color and discharge, no lesions              Cervix: normal, non tender              Pap taken: yes Bimanual Exam:  Uterus:  anteverted and upper limit of normal, with nodular firm feel c/w history of fibroids, non tender              Adnexa: normal adnexa and no mass, fullness, tenderness               Rectovaginal: Confirms               Anus:  normal sphincter tone, no lesions  A:  Well Woman with normal exam  Menopausal no HRT  Anxiety/Depression Lexapro working well ,desires continuance  History of fibroids,no size change  P:   Reviewed health and wellness pertinent to exam  Rx Lexapro see order  Discussed findings and aware of presence  Pap smear taken  today with HPVHR  Mammogram yearly  counseled on mammography screening, adequate intake of calcium and vitamin D, diet and exercise  return annually or prn  An After Visit Summary was printed and given to the patient.

## 2013-10-05 NOTE — Patient Instructions (Signed)

## 2013-10-06 NOTE — Progress Notes (Signed)
Reviewed personally.  M. Suzanne Fancy Dunkley, MD.  

## 2013-10-08 LAB — IPS PAP TEST WITH HPV

## 2013-10-16 ENCOUNTER — Ambulatory Visit (HOSPITAL_COMMUNITY)
Admission: RE | Admit: 2013-10-16 | Discharge: 2013-10-16 | Disposition: A | Discharge status: Home / Self Care | Payer: BC Managed Care – PPO | Source: Ambulatory Visit | Attending: Certified Nurse Midwife | Admitting: Certified Nurse Midwife

## 2013-10-16 DIAGNOSIS — Z1231 Encounter for screening mammogram for malignant neoplasm of breast: Secondary | ICD-10-CM | POA: Insufficient documentation

## 2013-10-16 DIAGNOSIS — R928 Other abnormal and inconclusive findings on diagnostic imaging of breast: Secondary | ICD-10-CM | POA: Insufficient documentation

## 2013-10-16 DIAGNOSIS — Z139 Encounter for screening, unspecified: Secondary | ICD-10-CM

## 2013-10-19 ENCOUNTER — Other Ambulatory Visit: Payer: Self-pay | Admitting: Certified Nurse Midwife

## 2013-10-19 DIAGNOSIS — R928 Other abnormal and inconclusive findings on diagnostic imaging of breast: Secondary | ICD-10-CM

## 2013-10-21 ENCOUNTER — Ambulatory Visit (HOSPITAL_COMMUNITY)
Admission: RE | Admit: 2013-10-21 | Discharge: 2013-10-21 | Disposition: A | Discharge status: Home / Self Care | Payer: BC Managed Care – PPO | Source: Ambulatory Visit | Attending: Certified Nurse Midwife | Admitting: Certified Nurse Midwife

## 2013-10-21 DIAGNOSIS — R928 Other abnormal and inconclusive findings on diagnostic imaging of breast: Secondary | ICD-10-CM

## 2013-10-26 ENCOUNTER — Encounter: Payer: Self-pay | Admitting: Certified Nurse Midwife

## 2013-10-27 ENCOUNTER — Ambulatory Visit: Payer: BC Managed Care – PPO | Admitting: Gynecology

## 2013-10-27 ENCOUNTER — Ambulatory Visit (INDEPENDENT_AMBULATORY_CARE_PROVIDER_SITE_OTHER): Payer: BC Managed Care – PPO

## 2013-10-27 ENCOUNTER — Telehealth: Payer: Self-pay | Admitting: Emergency Medicine

## 2013-10-27 ENCOUNTER — Encounter: Payer: Self-pay | Admitting: Gynecology

## 2013-10-27 ENCOUNTER — Ambulatory Visit (INDEPENDENT_AMBULATORY_CARE_PROVIDER_SITE_OTHER): Payer: BC Managed Care – PPO | Admitting: Gynecology

## 2013-10-27 VITALS — BP 96/56 | HR 68 | Resp 18 | Ht 62.75 in | Wt 110.0 lb

## 2013-10-27 DIAGNOSIS — N950 Postmenopausal bleeding: Secondary | ICD-10-CM

## 2013-10-27 DIAGNOSIS — N83209 Unspecified ovarian cyst, unspecified side: Secondary | ICD-10-CM

## 2013-10-27 NOTE — Telephone Encounter (Signed)
[facility] MESSAGE REPORT Message [[id number]]    From Montina W Willy   To Ladell Heads [[phone]]   Composed 10/26/2013 7:58 PM   For Delivery On 10/26/2013 7:58 PM   Subject Non-Urgent Medical Question   Message Type Patient Medical Advice Request   Read Status Y   Message Body Debbie, I need to tell you a couple of things. I had to redo the mammogram on my right breast. The follow up mammogram was fine. You will get the results. Clyde Canterbury got married on Saturday May 23. On Thursday May 21 I had some light bleeding that has continued until today. It seems to be finishing up today May 25. I felt some little twinges in my lower belly like a period. What shall I do?

## 2013-10-27 NOTE — Progress Notes (Signed)
Pt here reporting 4d of red-pink spotting.  She is not on hormones.  She bled 2y ago and had a negative evlaution.  Pt reports that she had mild cramping at the time.  She has never been on  HRT.  Menopause approx 3.5y ago.  Pt had been under a lot of stress, daughter married this weekend.  Pt denies itching, burning or vaginal discharge. No recent coitus.  Normal PAP 10/2013 PUS, 5/13 negative, normal SHG, EMB proliferative endometrium  BP 96/56  Pulse 68  Resp 18  Ht 5' 2.75" (1.594 m)  Wt 110 lb (49.896 kg)  BMI 19.64 kg/m2  LMP 09/19/2011 General appearance: alert, cooperative and appears stated age Abdomen: soft nontender  Pelvic: External genitalia:  no lesions              Urethra:  normal appearing urethra with no masses, tenderness or lesions              Bartholins and Skenes: normal                 Vagina: pale, thin, thin pink blood noted around cervix, no discharge              Cervix: hyperemic lesion noted 5-7 o'clock, no CMT                      Bimanual Exam:  Uterus:  uterus is normal size, shape, consistency and nontender                                      Adnexa: normal adnexa in size, nontender and no masses                                        Assessment: PMB off HRT  Plan: PUS today, possible EMB Risks and benefits reviewed and accepted Will see after PUS  Images PUS reviewed, lining thick 5.5, uterus normal, left ovary with cyst, thin walled, no flow, echofree.  No free fluid in cul de sac. Based on above, recommend EMB, risks and benefits reviewed and consent obtained. Speculum placed, cervix cleansed with betadine, xylocaine jelly placed anterior lip, pipelle advanced to 7cm ,minimal tissue obtained on 2 passes. Tolerated well Tissue to pathology. Repeat PUS in 46m for ovarian cyst

## 2013-10-27 NOTE — Telephone Encounter (Signed)
Message left to return call to [place] at [phone] on both home and mobile.

## 2013-10-29 LAB — IPS OTHER TISSUE BIOPSY

## 2013-10-30 NOTE — Telephone Encounter (Signed)
Spoke with patient. Advised of results as seen below. Patient would like to know if Dr.Lathrop would like her to start taking Progestin at this time as they had discussed. Advised that I would check with Dr.Lathrop if she did not mind being placed on hold. Patient agreeable. Advised patient that Dr.Lathrop would not like patient to take progestin at this time but will reevaluate in 3 months with the repeat ultrasound. Follow up ultrasound scheduled for 01/26/2014 at 3:30pm with a 4:00pm consult with Dr.Lathrop. Patient agreeable to date and time.  Notes Recorded by Azalia Bilis, MD on 10/30/2013 at 7:21 AM Inform biopsy is benign, repeat PUS in 81m for cyst-scheduled  Routing to provider for final review. Patient agreeable to disposition. Will close encounter

## 2013-11-11 ENCOUNTER — Encounter (HOSPITAL_COMMUNITY): Payer: BC Managed Care – PPO

## 2013-12-12 ENCOUNTER — Encounter: Payer: Self-pay | Admitting: Certified Nurse Midwife

## 2013-12-17 ENCOUNTER — Telehealth: Payer: Self-pay | Admitting: Emergency Medicine

## 2013-12-17 NOTE — Telephone Encounter (Signed)
Please call and triage pt to see if appt needed. Dr Sabra Heck ----- Message ----- From: Gloria W Lorenzetti Sent: 12/12/2013 5:30 PM To: [facility] Clinical Pool Subject: Non-Urgent Medical Question Debbie, I hope you are doing well and having a nice summer. For some reason I am displaying some of the same symptoms of anxiety in the last 3 weeks that I did last summer. Not to the extreme, but I am able to recognize it from past experience. My feet are worse and I just feel anxious. Today I just felt uneasy and a funny sensation in my chest. I finally relented and took half of a .5 mg xanex. It helped me. I am discouraged and not sure what this means or what I should do. Please advise and I am so sorry to trouble you with this. A a friend of mine recommended that I get my hormone levels checked at an integrated medical facility to see if I need hormone therapy. What are your thoughts about that?

## 2013-12-17 NOTE — Telephone Encounter (Signed)
Message left to return call to Letica Giaimo at [phone].    

## 2013-12-18 NOTE — Telephone Encounter (Signed)
Needs to schedule OV to discuss(will need to be 30 min. Slot)

## 2013-12-18 NOTE — Telephone Encounter (Signed)
Called patient and advised Morgan Harrison, CNM recommended office visit.  Made appt for 12-18-13 for consult.

## 2013-12-18 NOTE — Telephone Encounter (Signed)
Spoke with patient and message discussed. Patient states she is feeling better. Declines office visit.   Patient would really like to know if there is any further testing that Debbi can do to evaluate her hormones. If so, she will schedule office visit. If not, she wants to know Debbi's opinion on hormone testing and using the integrative medicine centers.

## 2013-12-28 ENCOUNTER — Ambulatory Visit (INDEPENDENT_AMBULATORY_CARE_PROVIDER_SITE_OTHER): Payer: BC Managed Care – PPO | Admitting: Certified Nurse Midwife

## 2013-12-28 ENCOUNTER — Encounter: Payer: Self-pay | Admitting: Certified Nurse Midwife

## 2013-12-28 VITALS — BP 98/64 | HR 68 | Resp 16 | Ht 62.75 in | Wt 109.0 lb

## 2013-12-28 DIAGNOSIS — F411 Generalized anxiety disorder: Secondary | ICD-10-CM

## 2013-12-28 NOTE — Progress Notes (Signed)
Reviewed personally.  M. Suzanne Brentlee Sciara, MD.  

## 2013-12-28 NOTE — Progress Notes (Signed)
58 y.o.Married [place] female G3P3 here for follow-up of anxiety disorder being treated with  Lexapro.   Initiated 01/09/13.  Patient taking medication as instructed PM.  Denies nausea, headache or other medication side effects. Reports no crying,  panic attacks,  Insomnia, fatigue,or thoughts of self harm or others   Feelings of anxiety improved and learning when she needs to slow down and not over commit.   Seeing J.Whitt,Counselor every 2 months now. Patient also had a question about using hormones to see if she used those if she would not need Lexapro.     O:Healthy WD,WN female, appropriately dressed        A:1-Anxiety responding to Lexapro 2-Counseling in progress  3-HRT  option  P:1- Continue medication as prescribed 2-RX has refills 3-Discussed risks and benefits of HRT and Lexapro. Patient did not want to use HRT, due to her fear of. Discussed she is doing will with Lexapro and no hot flashes, would she want to change since she is feeling so much better. Patient does not feel change needed. 4-Instructed if thoughts of self harm or others seek immediate help 911 or emergency room.   Questions addressed.              Rv prn , aex   32 minutes spent with patient with  in face to face counseling.

## 2014-01-26 ENCOUNTER — Ambulatory Visit (INDEPENDENT_AMBULATORY_CARE_PROVIDER_SITE_OTHER): Payer: BC Managed Care – PPO

## 2014-01-26 ENCOUNTER — Ambulatory Visit (INDEPENDENT_AMBULATORY_CARE_PROVIDER_SITE_OTHER): Payer: BC Managed Care – PPO | Admitting: Gynecology

## 2014-01-26 VITALS — BP 110/72 | Resp 18 | Ht 62.75 in | Wt 111.0 lb

## 2014-01-26 DIAGNOSIS — N83202 Unspecified ovarian cyst, left side: Secondary | ICD-10-CM

## 2014-01-26 DIAGNOSIS — N83209 Unspecified ovarian cyst, unspecified side: Secondary | ICD-10-CM

## 2014-01-26 NOTE — Progress Notes (Signed)
       Pt here for f/u of left ovarian cyst that was noted on PUS done for PMB. Images reviewed with pt. Pt denies any further bleeding since last episode. She did not take any hormones. Uterus is unchanged, small intramural fibroid noted. EMS decreased from 5.5 to 1.43mm Ovaries are atrophic in appearance and cyst has resolved. No free fluid. Pt pleased. Etiology of PMB reviewed. Pt asked to contact office for any further bleeding  Questions addressed. 64m spent counseling, >50% face to face

## 2014-02-22 ENCOUNTER — Other Ambulatory Visit: Payer: Self-pay

## 2014-02-22 MED ORDER — ESCITALOPRAM OXALATE 10 MG PO TABS: 10.0000 mg | ORAL_TABLET | Freq: Every day | ORAL | Status: DC

## 2014-02-22 NOTE — Telephone Encounter (Signed)
Yes but only for 3 refills

## 2014-02-22 NOTE — Telephone Encounter (Signed)
Per Ms. Debbi, pt can have 2 refills. Sent refills to pharmacy

## 2014-02-22 NOTE — Telephone Encounter (Signed)
Last AEX: 10/05/13 Last refill:10/05/13 #30 X 6 Current AEX:10/07/14  Pt ins is requesting a 90 supply for a home delivery. Is this ok?  Please advise

## 2014-03-19 ENCOUNTER — Other Ambulatory Visit: Payer: Self-pay

## 2014-04-05 ENCOUNTER — Encounter: Payer: Self-pay | Admitting: Certified Nurse Midwife

## 2014-06-07 ENCOUNTER — Encounter: Payer: Self-pay | Admitting: Certified Nurse Midwife

## 2014-06-11 ENCOUNTER — Telehealth: Payer: Self-pay | Admitting: Certified Nurse Midwife

## 2014-06-11 NOTE — Telephone Encounter (Signed)
Patient wants to talk with the nurse no information given.  °

## 2014-06-11 NOTE — Telephone Encounter (Signed)
Spoke with patient. Patient is currently taking Vitamin D3 1,000 IU daily.

## 2014-06-11 NOTE — Telephone Encounter (Signed)
Spoke with patient. Patient is not at home currently and does not remember dosage. Requests return call at 4:15pm. Advised will return call at 4:15 to discuss dosage.

## 2014-06-11 NOTE — Telephone Encounter (Signed)
Spoke with patient. Patient is calling in regards to mychart message sent to Regina Eck CNM. See my chart message below. States she was previously on a prescription for Vitamin D which brought her level up to 40. Since then has been taking OTC Vitamin D3 daily. Had recheck of Vitamin D level in Dec and level was 35. "Since it has dropped I have noticed it. I feel more fatigued and gloomy. I did not know if she had any recommendations on what I can do so that the level will not drop any further." Advised will need to send a message over to covering provider as Regina Eck CNM is out of the office today and return call with further recommendations. Patient is agreeable.

## 2014-06-11 NOTE — Telephone Encounter (Signed)
From pt listed med's it is hard to say what her dose of Vit D 3 dose is per day.  We now know that daily Vit D 3 is better for good cell regeneration and cancer prevention.  We just need to get her OTC dose up high enough.  Find out about the dose and we can make a recommendation from that.

## 2014-06-11 NOTE — Telephone Encounter (Signed)
Left message to call Kenyatte Chatmon at [phone]. 

## 2014-06-14 NOTE — Telephone Encounter (Signed)
So on OTC 1000 IU daily is not enough to keep her Vit D level high enough.  So I would recommend 2000 IU daily.  She may take 2 of the 1000 IU until she runs out then buy the 2000 IU dose.  This is especially needed during the winter months.

## 2014-06-15 NOTE — Telephone Encounter (Signed)
Spoke with patient. Advised patient of message as seen below from [place] Cage, [place]. Patient is agreeable and verbalizes understanding.  Routing to provider for final review. Patient agreeable to disposition. Will close encounter ;

## 2014-06-15 NOTE — Telephone Encounter (Signed)
Left message to call Kaitlyn at [phone]. 

## 2014-09-17 ENCOUNTER — Other Ambulatory Visit: Payer: Self-pay | Admitting: Certified Nurse Midwife

## 2014-09-17 DIAGNOSIS — Z1231 Encounter for screening mammogram for malignant neoplasm of breast: Secondary | ICD-10-CM

## 2014-10-07 ENCOUNTER — Ambulatory Visit (INDEPENDENT_AMBULATORY_CARE_PROVIDER_SITE_OTHER): Payer: BC Managed Care – PPO | Admitting: Certified Nurse Midwife

## 2014-10-07 ENCOUNTER — Encounter: Payer: Self-pay | Admitting: Certified Nurse Midwife

## 2014-10-07 VITALS — BP 112/68 | HR 80 | Resp 16 | Ht 63.0 in | Wt 113.0 lb

## 2014-10-07 DIAGNOSIS — Z23 Encounter for immunization: Secondary | ICD-10-CM

## 2014-10-07 DIAGNOSIS — Z01419 Encounter for gynecological examination (general) (routine) without abnormal findings: Secondary | ICD-10-CM

## 2014-10-07 DIAGNOSIS — Z0000 Encounter for general adult medical examination without abnormal findings: Secondary | ICD-10-CM

## 2014-10-07 DIAGNOSIS — Z124 Encounter for screening for malignant neoplasm of cervix: Secondary | ICD-10-CM | POA: Diagnosis not present

## 2014-10-07 DIAGNOSIS — F411 Generalized anxiety disorder: Secondary | ICD-10-CM

## 2014-10-07 LAB — POCT URINALYSIS DIPSTICK
BILIRUBIN UA: NEGATIVE
Blood, UA: NEGATIVE
GLUCOSE UA: NEGATIVE
KETONES UA: NEGATIVE
LEUKOCYTES UA: NEGATIVE
NITRITE UA: NEGATIVE
Protein, UA: NEGATIVE
Urobilinogen, UA: NEGATIVE
pH, UA: 6

## 2014-10-07 NOTE — Progress Notes (Signed)
59 y.o. G3P3 Married  Caucasian Fe here for annual exam. Menopausal no HRT. Denies vaginal bleeding or vaginal dryness. Sees PCP for labs and aex. Cholesterol and glucose much better, but vitamin d still low.Loma Sousa working well, considering weaning off, but afraid the symptoms of anxiety will return. Daughters doing well, one more wedding this year and all married!! No other health issues today.  Patient's last menstrual period was 09/19/2011.          Sexually active: Yes.    The current method of family planning is post menopausal status.    Exercising: Yes.    Walking Smoker:  no  Health Maintenance: Pap:  10/05/13 Neg. HR HPV:Neg MMG: 10/16/13 BIRADS0:Incomplete.  10/21/13 Diagnostic Right BIRADS1:neg Self Breast Exam: Not regularly. Colonoscopy:  08/2009 BMD:   10/2008 Heel Scan TDaP:  2004.  Labs: PCP UA: Clear   reports that she has never smoked. She has never used smokeless tobacco. She reports that she does not drink alcohol or use illicit drugs.  Past Medical History  Diagnosis Date  . Fibroid   . Anxiety and depression   . Shingles     Past Surgical History  Procedure Laterality Date  . Tubal ligation  1993    BTL  . Cesarean section  1993  . Tonsillectomy  1963  . Endometrial biopsy      x2    Current Outpatient Prescriptions  Medication Sig Dispense Refill  . aspirin 81 MG tablet Take 81 mg by mouth daily.    Marland Kitchen b complex vitamins tablet Take 1 tablet by mouth daily.    . cetirizine (ZYRTEC) 10 MG tablet Take 10 mg by mouth as needed for allergies.    . Cholecalciferol (VITAMIN D PO) Take by mouth daily.    Marland Kitchen escitalopram (LEXAPRO) 10 MG tablet Take 1 tablet (10 mg total) by mouth at bedtime. 90 tablet 2  . fluticasone (FLONASE) 50 MCG/ACT nasal spray as needed.    . Probiotic Product (PROBIOTIC PO) Take by mouth daily.    . valACYclovir (VALTREX) 1000 MG tablet as needed.      No current facility-administered medications for this visit.    Family History   Problem Relation Age of Onset  . Neuropathy Father     idiopathic    ROS:  Pertinent items are noted in HPI.  Otherwise, a comprehensive ROS was negative.  Exam:   BP 112/68 mmHg  Pulse 80  Resp 16  Ht 5\' 3"  (1.6 m)  Wt 113 lb (51.256 kg)  BMI 20.02 kg/m2  LMP 09/19/2011 Height: 5\' 3"  (160 cm) Ht Readings from Last 3 Encounters:  10/07/14 5\' 3"  (1.6 m)  01/26/14 5' 2.75" (1.594 m)  12/28/13 5' 2.75" (1.594 m)    General appearance: alert, cooperative and appears stated age Head: Normocephalic, without obvious abnormality, atraumatic Neck: no adenopathy, supple, symmetrical, trachea midline and thyroid normal to inspection and palpation Lungs: clear to auscultation bilaterally Breasts: normal appearance, no masses or tenderness, No nipple retraction or dimpling, No nipple discharge or bleeding, No axillary or supraclavicular adenopathy Heart: regular rate and rhythm Abdomen: soft, non-tender; no masses,  no organomegaly Extremities: extremities normal, atraumatic, no cyanosis or edema Skin: Skin color, texture, turgor normal. No rashes or lesions Lymph nodes: Cervical, supraclavicular, and axillary nodes normal. No abnormal inguinal nodes palpated Neurologic: Grossly normal   Pelvic: External genitalia:  no lesions              Urethra:  normal  appearing urethra with no masses, tenderness or lesions              Bartholin's and Skene's: normal                 Vagina: normal appearing vagina with normal color and discharge, no lesions              Cervix: normal,non tender,no lesions              Pap taken: Yes Bimanual Exam:  Uterus:  normal size, contour, position, consistency, mobility, non-tender              Adnexa: normal adnexa and no mass, fullness, tenderness               Rectovaginal: Confirms               Anus:  normal sphincter tone, no lesions  Chaperone present: Yes  A:  Well Woman with normal exam  Menopausal no HRT  Anxiety Lexapro working  well  Vitamin D deficiency, PCP manages  Immunization due  History of abnormal pap ASCUS only repeat pap today  P:   Reviewed health and wellness pertinent to exam  Aware of need to evaluate if vaginal bleeding  Rx Lexapro, will advise if desires to wean off  Continue with PCP as indicated  Requests TDAP  Pap smear taken today with HPVHR   counseled on breast self exam, mammography screening, adequate intake of calcium and vitamin D, diet and exercise  return annually or prn  An After Visit Summary was printed and given to the patient.

## 2014-10-07 NOTE — Patient Instructions (Signed)

## 2014-10-08 MED ORDER — ESCITALOPRAM OXALATE 10 MG PO TABS: 10.0000 mg | ORAL_TABLET | Freq: Every day | ORAL | Status: DC

## 2014-10-09 NOTE — Progress Notes (Signed)
Reviewed personally.  M. Suzanne Rhyker Silversmith, MD.  

## 2014-10-12 LAB — IPS PAP TEST WITH HPV

## 2014-10-21 ENCOUNTER — Ambulatory Visit (HOSPITAL_COMMUNITY): Payer: BC Managed Care – PPO

## 2014-11-17 ENCOUNTER — Ambulatory Visit (HOSPITAL_COMMUNITY)
Admission: RE | Admit: 2014-11-17 | Discharge: 2014-11-17 | Disposition: A | Discharge status: Home / Self Care | Payer: BC Managed Care – PPO | Source: Ambulatory Visit | Attending: Certified Nurse Midwife | Admitting: Certified Nurse Midwife

## 2014-11-17 DIAGNOSIS — Z1231 Encounter for screening mammogram for malignant neoplasm of breast: Secondary | ICD-10-CM | POA: Insufficient documentation

## 2015-07-20 ENCOUNTER — Other Ambulatory Visit: Payer: Self-pay

## 2015-07-20 DIAGNOSIS — F411 Generalized anxiety disorder: Secondary | ICD-10-CM

## 2015-07-20 NOTE — Telephone Encounter (Signed)
Medication refill request: Lexapro Last AEX:  10/07/14 DL Next AEX: 10/13/15 DL Last MMG (if hormonal medication request): 11/17/14 BIRADS Category 1 Negative Refill authorized: 10/08/14 #90 tablets 3 Refills  Today: # 90 tabs 0 Refills

## 2015-07-21 MED ORDER — ESCITALOPRAM OXALATE 10 MG PO TABS: 10.0000 mg | ORAL_TABLET | Freq: Every day | ORAL | Status: DC

## 2015-09-30 ENCOUNTER — Other Ambulatory Visit: Payer: Self-pay | Admitting: Certified Nurse Midwife

## 2015-09-30 NOTE — Telephone Encounter (Signed)
Medication refill request: Lexapro 10 mg  Last AEX:  10/07/2014 with DL  Next AEX: 10/13/15 with DL Last MMG (if hormonal medication request): n/a Refill authorized: #90

## 2015-10-13 ENCOUNTER — Ambulatory Visit (INDEPENDENT_AMBULATORY_CARE_PROVIDER_SITE_OTHER): Payer: BC Managed Care – PPO | Admitting: Certified Nurse Midwife

## 2015-10-13 ENCOUNTER — Encounter: Payer: Self-pay | Admitting: Certified Nurse Midwife

## 2015-10-13 VITALS — BP 100/60 | HR 70 | Resp 16 | Ht 62.75 in | Wt 116.0 lb

## 2015-10-13 DIAGNOSIS — Z01419 Encounter for gynecological examination (general) (routine) without abnormal findings: Secondary | ICD-10-CM

## 2015-10-13 DIAGNOSIS — Z0000 Encounter for general adult medical examination without abnormal findings: Secondary | ICD-10-CM

## 2015-10-13 LAB — POCT URINALYSIS DIPSTICK
Bilirubin, UA: NEGATIVE
Blood, UA: NEGATIVE
Glucose, UA: NEGATIVE
Ketones, UA: NEGATIVE
NITRITE UA: NEGATIVE
PH UA: 5
Protein, UA: NEGATIVE
Urobilinogen, UA: NEGATIVE

## 2015-10-13 NOTE — Progress Notes (Signed)
60 y.o. G3P3 Married  Caucasian Fe here for annual exam. Menopausal no HRT. Denies vaginal bleeding or vaginal dryness. Sees PCP for labs and aex. Lexapro working well for anxiety but would like to try to reduce dosage and eventually stop taking if possible. Very busy now with work and with new Loletha Carrow! No health issues today.  Patient's last menstrual period was 09/19/2011.          Sexually active: Yes.    The current method of family planning is post menopausal status.    Exercising: Yes.    Walking 2 x weekly Smoker:  no  Health Maintenance: Pap:  10/07/14 Neg. HR HPV:neg MMG:  11/18/14 BIRADS1:neg Colonoscopy:  08/26/09 Polyps 10 years BMD:   10/11/2008 TDaP:  10/07/2014 Shingles: Never Pneumonia: Never Hep C and HIV: 1993 HIV= Neg Labs: PCP UA: WBC=Small   reports that she has never smoked. She has never used smokeless tobacco. She reports that she does not drink alcohol or use illicit drugs.  Past Medical History  Diagnosis Date  . Fibroid   . Anxiety and depression   . Shingles     Past Surgical History  Procedure Laterality Date  . Tubal ligation  1993    BTL  . Cesarean section  1993  . Tonsillectomy  1963  . Endometrial biopsy      x2    Current Outpatient Prescriptions  Medication Sig Dispense Refill  . aspirin 81 MG tablet Take 81 mg by mouth daily.    Marland Kitchen b complex vitamins tablet Take 1 tablet by mouth daily.    . cetirizine (ZYRTEC) 10 MG tablet Take 10 mg by mouth as needed for allergies.    . Cholecalciferol (VITAMIN D PO) Take by mouth daily.    Marland Kitchen escitalopram (LEXAPRO) 10 MG tablet TAKE 1 TABLET AT BEDTIME 90 tablet 0  . fluticasone (FLONASE) 50 MCG/ACT nasal spray as needed.    . Probiotic Product (PROBIOTIC PO) Take by mouth daily.    . valACYclovir (VALTREX) 1000 MG tablet as needed. Reported on 10/13/2015     No current facility-administered medications for this visit.    Family History  Problem Relation Age of Onset  . Neuropathy Father      idiopathic    ROS:  Pertinent items are noted in HPI.  Otherwise, a comprehensive ROS was negative.  Exam:   BP 100/60 mmHg  Pulse 70  Resp 16  Ht 5' 2.75" (1.594 m)  Wt 116 lb (52.617 kg)  BMI 20.71 kg/m2  LMP 09/19/2011 Height: 5' 2.75" (159.4 cm) Ht Readings from Last 3 Encounters:  10/13/15 5' 2.75" (1.594 m)  10/07/14 5\' 3"  (1.6 m)  01/26/14 5' 2.75" (1.594 m)    General appearance: alert, cooperative and appears stated age Head: Normocephalic, without obvious abnormality, atraumatic Neck: no adenopathy, supple, symmetrical, trachea midline and thyroid normal to inspection and palpation Lungs: clear to auscultation bilaterally Breasts: normal appearance, no masses or tenderness, No nipple retraction or dimpling, No nipple discharge or bleeding, No axillary or supraclavicular adenopathy Heart: regular rate and rhythm Abdomen: soft, non-tender; no masses,  no organomegaly Extremities: extremities normal, atraumatic, no cyanosis or edema Skin: Skin color, texture, turgor normal. No rashes or lesions Lymph nodes: Cervical, supraclavicular, and axillary nodes normal. No abnormal inguinal nodes palpated Neurologic: Grossly normal   Pelvic: External genitalia:  no lesions              Urethra:  normal appearing urethra with no masses,  tenderness or lesions              Bartholin's and Skene's: normal                 Vagina: normal appearing vagina with normal color and discharge, no lesions              Cervix: multiparous appearance, no lesions and normal              Pap taken: No. 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  P:   Reviewed health and wellness pertinent to exam  Aware of need to evaluate if vaginal bleeding  Will call if needed  Discussed splitting Lexapro in half and reducing to 5 mg daily for one month and advise. Patient feels this is a good idea and will call if issues, has refill.  Pap smear as above not taken   counseled on breast self exam, mammography screening, adequate intake of calcium and vitamin D, diet and exercise. Will schedule BMD with mammogram.  return annually or prn  An After Visit Summary was printed and given to the patient.

## 2015-10-13 NOTE — Progress Notes (Signed)
Reviewed personally.  M. Suzanne Haruo Stepanek, MD.  

## 2015-10-18 ENCOUNTER — Other Ambulatory Visit: Payer: Self-pay | Admitting: Certified Nurse Midwife

## 2015-10-18 ENCOUNTER — Telehealth: Payer: Self-pay | Admitting: Certified Nurse Midwife

## 2015-10-18 DIAGNOSIS — Z1231 Encounter for screening mammogram for malignant neoplasm of breast: Secondary | ICD-10-CM

## 2015-10-18 DIAGNOSIS — Z780 Asymptomatic menopausal state: Secondary | ICD-10-CM

## 2015-10-18 DIAGNOSIS — E2839 Other primary ovarian failure: Secondary | ICD-10-CM

## 2015-10-18 NOTE — Telephone Encounter (Signed)
Patient called and requested an order for her bone density "be sent to [facility] on paper." She has a MMG on 11/23/15 at 9:00 AM and would like to have them both done at the same time. She's like to know if she will need to call once them they receive the order or if they will just add it on.  Phone for [name], if needed: [phone]

## 2015-10-18 NOTE — Telephone Encounter (Signed)
Spoke with patient. Advised I have sent an order for bone density to be performed at [facility] via [facility] as they share the same computer system as [place]. Advised she will need to contact [name] to let them know she would like to have her BMD done at the time of her mammogram appointment. Advised they may need to adjust her appointment time with adding the BMD. She is agreeable and verbalizes understanding.  Routing to provider for final review. Patient agreeable to disposition. Will close encounter.

## 2015-11-21 ENCOUNTER — Other Ambulatory Visit: Payer: Self-pay | Admitting: Certified Nurse Midwife

## 2015-11-21 NOTE — Telephone Encounter (Signed)
Medication refill request: Lexapro   Last AEX:  10-13-15  Next AEX: 10-16-16 Last MMG (if hormonal medication request): 11-17-14 WNL  Refill authorized: please advise

## 2015-11-23 ENCOUNTER — Ambulatory Visit (HOSPITAL_COMMUNITY)
Admission: RE | Admit: 2015-11-23 | Discharge: 2015-11-23 | Disposition: A | Discharge status: Home / Self Care | Payer: BC Managed Care – PPO | Source: Ambulatory Visit | Attending: Certified Nurse Midwife | Admitting: Certified Nurse Midwife

## 2015-11-23 DIAGNOSIS — Z780 Asymptomatic menopausal state: Secondary | ICD-10-CM | POA: Insufficient documentation

## 2015-11-23 DIAGNOSIS — E2839 Other primary ovarian failure: Secondary | ICD-10-CM

## 2015-11-23 DIAGNOSIS — Z1231 Encounter for screening mammogram for malignant neoplasm of breast: Secondary | ICD-10-CM | POA: Diagnosis not present

## 2015-11-23 DIAGNOSIS — M8580 Other specified disorders of bone density and structure, unspecified site: Secondary | ICD-10-CM | POA: Diagnosis not present

## 2015-11-30 ENCOUNTER — Telehealth: Payer: Self-pay

## 2015-11-30 NOTE — Telephone Encounter (Signed)
-----   Message from Regina Eck, CNM sent at 11/30/2015 12:24 PM EDT ----- Notify patient BMD shows slight decrease in spine density and right hip compared to last BMD in 2004. Low bone mass noted in these areas only. Needs to make sure she is getting 4 servings of calcium in daily and supplement with 600 mg calcium citrate with Vitamin D daily. Regular weight bearing exercise and stretching joint flex ability. Repeat in 2 years

## 2015-11-30 NOTE — Telephone Encounter (Signed)
lmtcb

## 2015-12-01 NOTE — Telephone Encounter (Signed)
Patient returning Joy's call from yesterday.

## 2015-12-01 NOTE — Telephone Encounter (Signed)
Patient called back and I gave BMD results. Patient voiced understanding -eh

## 2015-12-01 NOTE — Telephone Encounter (Signed)
Left message to call back  

## 2015-12-13 ENCOUNTER — Telehealth: Payer: Self-pay | Admitting: Certified Nurse Midwife

## 2015-12-13 NOTE — Telephone Encounter (Signed)
Calcium carbonate with vitamin K if possible

## 2015-12-13 NOTE — Telephone Encounter (Signed)
Spoke with patient. Advised message as seen below from [place]. She is agreeable and verbalizes understanding.  Routing to provider for final review. Patient agreeable to disposition. Will close encounter.

## 2015-12-13 NOTE — Telephone Encounter (Signed)
Patient called and said, "I'd like to know what type of calcium Debbi recomends."

## 2015-12-13 NOTE — Telephone Encounter (Signed)
Routing to Deborah Leonard CNM for review and advise. 

## 2015-12-14 ENCOUNTER — Encounter: Payer: Self-pay | Admitting: Certified Nurse Midwife

## 2015-12-14 ENCOUNTER — Telehealth: Payer: Self-pay

## 2015-12-14 NOTE — Telephone Encounter (Signed)
Telephone encounter created to discuss with Melvia Heaps CNM.

## 2015-12-14 NOTE — Telephone Encounter (Signed)
Non-Urgent Medical Question  Message [id number]   From  Raushanah W Shein   To  Regina Eck, CNM   Sent  12/14/2015 10:05 AM     Debbie, you recommended that I take calcium and vitamin k. Can you elaborate on that for me. So many studies show calcium to be ineffective and even to cause heart issues. I just need to understand a little better. I trust your judgement and will do what you think to be best, but I just need a little more insight. Thanks so much.      Responsible Party    Pool - [facility] Clinical Pool No one has taken responsibility for this message.     No actions have been taken on this message.     Per patient's BMD results patient had bone loss in her spine and right hip. Please see telephone encounter dated 11/30/2015. Melvia Heaps CNM recommended that the patient start on calcium carbonate with vitamin K to reduce bone loss. Patient sent mychart message seen above with additional questions regarding why this is recommended. Routing to [employer] CNM for review and advise.

## 2015-12-20 NOTE — Telephone Encounter (Signed)
Routing to Deborah Leonard CNM for review and advise. 

## 2015-12-21 NOTE — Telephone Encounter (Signed)
Spoke with patient. Advised of message as seen below from [place]. She is agreeable and verbalizes understanding.  Routing to provider for final review. Patient agreeable to disposition. Will close encounter.

## 2015-12-21 NOTE — Telephone Encounter (Signed)
Left message to call Kaitlyn at [phone]. 

## 2015-12-21 NOTE — Telephone Encounter (Signed)
Notify patient that [place] Bone Strength is an excellent profile for support with Calcium ( no limestone contained) with Vit. D and Vit K for absorption. I use this personally. Will need to purchase at a health food store. [employer] and the [facility] in [place] carries this.

## 2016-10-16 ENCOUNTER — Encounter: Payer: Self-pay | Admitting: Certified Nurse Midwife

## 2016-10-16 ENCOUNTER — Ambulatory Visit (INDEPENDENT_AMBULATORY_CARE_PROVIDER_SITE_OTHER): Payer: BC Managed Care – PPO | Admitting: Certified Nurse Midwife

## 2016-10-16 VITALS — BP 110/70 | HR 68 | Resp 16 | Ht 62.75 in | Wt 116.0 lb

## 2016-10-16 DIAGNOSIS — Z01419 Encounter for gynecological examination (general) (routine) without abnormal findings: Secondary | ICD-10-CM | POA: Diagnosis not present

## 2016-10-16 DIAGNOSIS — F419 Anxiety disorder, unspecified: Secondary | ICD-10-CM | POA: Diagnosis not present

## 2016-10-16 DIAGNOSIS — N952 Postmenopausal atrophic vaginitis: Secondary | ICD-10-CM | POA: Diagnosis not present

## 2016-10-16 DIAGNOSIS — Z124 Encounter for screening for malignant neoplasm of cervix: Secondary | ICD-10-CM

## 2016-10-16 MED ORDER — ESCITALOPRAM OXALATE 10 MG PO TABS: 10.0000 mg | ORAL_TABLET | Freq: Every day | ORAL | 3 refills | Status: DC

## 2016-10-16 NOTE — Progress Notes (Signed)
61 y.o. G3P3 Married  Caucasian Fe here for annual exam. Menopausal no HRT. Denies vaginal bleeding. Some vaginal dryness has not tried anything for it but OTC lubricants that do not work. Saw PCP last month and all labs were normal. Staying busy with work and helping daughter with new baby.  Desires continuance of Lexapro for anxiety, working well. No shingle outbreaks, no refill of Valtrex needed. No other health issues today. Plans to retire next year!  Patient's last menstrual period was 09/19/2011.          Sexually active: Yes.    The current method of family planning is post menopausal status.    Exercising: Yes.    walking Smoker:  no  Health Maintenance: Pap:  10-07-14 neg HPV HR neg History of Abnormal Pap: yes just a repeat. No treatment MMG:  11-23-15 category c density birads 1:neg Self Breast exams: occ Colonoscopy:  2011 polyps f/u 10 per patient BMD:   2017 low bone mass right hip only TDaP:  2016 Shingles: no Pneumonia: no Hep C and HIV: 1993 HIV neg Labs: none   reports that she has never smoked. She has never used smokeless tobacco. She reports that she does not drink alcohol or use drugs.  Past Medical History:  Diagnosis Date  . Anxiety and depression   . Fibroid   . Shingles     Past Surgical History:  Procedure Laterality Date  . CESAREAN SECTION  1993  . ENDOMETRIAL BIOPSY     x2  . TONSILLECTOMY  1963  . TUBAL LIGATION  1993   BTL    Current Outpatient Prescriptions  Medication Sig Dispense Refill  . ALPHA-LIPOIC ACID PO Take by mouth.    Marland Kitchen aspirin 81 MG tablet Take 81 mg by mouth daily.    . cetirizine (ZYRTEC) 10 MG tablet Take 10 mg by mouth as needed for allergies.    . Cholecalciferol (VITAMIN D PO) Take by mouth daily.    Marland Kitchen escitalopram (LEXAPRO) 10 MG tablet TAKE 1 TABLET AT BEDTIME 90 tablet 3  . fluticasone (FLONASE) 50 MCG/ACT nasal spray as needed.    . Probiotic Product (PROBIOTIC PO) Take by mouth daily.    . valACYclovir (VALTREX)  1000 MG tablet as needed. Reported on 10/13/2015    . Vitamins-Lipotropics (B-50 PO) Take by mouth.     No current facility-administered medications for this visit.     Family History  Problem Relation Age of Onset  . Neuropathy Father        idiopathic    ROS:  Pertinent items are noted in HPI.  Otherwise, a comprehensive ROS was negative.  Exam:   BP 110/70   Pulse 68   Resp 16   Ht 5' 2.75" (1.594 m)   Wt 116 lb (52.6 kg)   LMP 09/19/2011   BMI 20.71 kg/m  Height: 5' 2.75" (159.4 cm) Ht Readings from Last 3 Encounters:  10/16/16 5' 2.75" (1.594 m)  10/13/15 5' 2.75" (1.594 m)  10/07/14 5\' 3"  (1.6 m)    General appearance: alert, cooperative and appears stated age Head: Normocephalic, without obvious abnormality, atraumatic Neck: no adenopathy, supple, symmetrical, trachea midline and thyroid normal to inspection and palpation Lungs: clear to auscultation bilaterally Breasts: normal appearance, no masses or tenderness, No nipple retraction or dimpling, No nipple discharge or bleeding, No axillary or supraclavicular adenopathy Heart: regular rate and rhythm Abdomen: soft, non-tender; no masses,  no organomegaly Extremities: extremities normal, atraumatic, no cyanosis or edema  Skin: Skin color, texture, turgor normal. No rashes or lesions Lymph nodes: Cervical, supraclavicular, and axillary nodes normal. No abnormal inguinal nodes palpated Neurologic: Grossly normal   Pelvic: External genitalia:  no lesions              Urethra:  normal appearing urethra with no masses, tenderness or lesions              Bartholin's and Skene's: normal                 Vagina: atrophic appearing vagina with normal color and no moisture noted, no lesions              Cervix: multiparous appearance, no cervical motion tenderness and no lesions              Pap taken: Yes.   Bimanual Exam:  Uterus:  normal size, contour, position, consistency, mobility, non-tender and anteflexed               Adnexa: normal adnexa and no mass, fullness, tenderness               Rectovaginal: Confirms               Anus:  normal sphincter tone, no lesions  Chaperone present: yes  A:  Well Woman with normal exam  Menopausal no HRT  Atrophic vaginitis  Anxiety Lexapro working well, desires continuance  P:   Reviewed health and wellness pertinent to exam  Aware of need to advise if vaginal bleeding  Discussed atrophic vaginitis finding and need for moisture use with OTC or Vaginal estrogen cream or tablets. Discussed etiology and increase risk of UTI and vaginal infections with atrophy. Questions addressed. Patient does not want estrogen use. Discussed OTC counter options, plans to try coconut oil,instructions given. Will advise if no change.  Risks/benefits of Lexapro reviewed and warning signs.  Rx Lexapro see order with instructions  Pap smear: yes   counseled on breast self exam, mammography screening, adequate intake of calcium and vitamin D, diet and exercise  return annually or prn  An After Visit Summary was printed and given to the patient.

## 2016-10-16 NOTE — Patient Instructions (Signed)

## 2016-10-19 LAB — IPS PAP TEST WITH REFLEX TO HPV

## 2016-10-23 ENCOUNTER — Other Ambulatory Visit: Payer: Self-pay | Admitting: Certified Nurse Midwife

## 2016-10-23 DIAGNOSIS — Z1231 Encounter for screening mammogram for malignant neoplasm of breast: Secondary | ICD-10-CM

## 2016-11-26 ENCOUNTER — Ambulatory Visit (HOSPITAL_COMMUNITY): Payer: BC Managed Care – PPO

## 2016-11-29 ENCOUNTER — Ambulatory Visit (HOSPITAL_COMMUNITY): Payer: BC Managed Care – PPO

## 2016-12-06 ENCOUNTER — Ambulatory Visit (HOSPITAL_COMMUNITY)
Admission: RE | Admit: 2016-12-06 | Discharge: 2016-12-06 | Disposition: A | Discharge status: Home / Self Care | Payer: BC Managed Care – PPO | Source: Ambulatory Visit | Attending: Certified Nurse Midwife | Admitting: Certified Nurse Midwife

## 2016-12-06 DIAGNOSIS — Z1231 Encounter for screening mammogram for malignant neoplasm of breast: Secondary | ICD-10-CM | POA: Diagnosis not present

## 2017-01-17 ENCOUNTER — Encounter: Payer: Self-pay | Admitting: Certified Nurse Midwife

## 2017-01-18 ENCOUNTER — Telehealth: Payer: Self-pay | Admitting: *Deleted

## 2017-01-18 DIAGNOSIS — F419 Anxiety disorder, unspecified: Secondary | ICD-10-CM

## 2017-01-18 MED ORDER — ESCITALOPRAM OXALATE 10 MG PO TABS: 10.0000 mg | ORAL_TABLET | Freq: Every day | ORAL | 2 refills | Status: DC

## 2017-01-18 NOTE — Telephone Encounter (Signed)
RX sent to [facility] with #90 as requested

## 2017-01-18 NOTE — Telephone Encounter (Signed)
Medication refill request: Lexapro  (VIA MY CHART)  Last AEX:  10-16-16  Next AEX: 10-17-17  Last MMG (if hormonal medication request): 12-06-16 WNL  Refill authorized: please advise        Debbie, I would like to have the escitalopram prescription refilled with [facility] in [place]. Would you do that for me? The phone # is [phone]. Thank you! I would like to refill it for 90 days if possible. I have been receiving it in the mail from [facility]. That prescription has expired and I do not want to renew it with them. Thank you!

## 2017-01-18 NOTE — Telephone Encounter (Signed)
OK to change location and number for RF.  Thanks.

## 2017-01-21 ENCOUNTER — Telehealth: Payer: Self-pay | Admitting: Certified Nurse Midwife

## 2017-01-21 DIAGNOSIS — F419 Anxiety disorder, unspecified: Secondary | ICD-10-CM

## 2017-01-21 MED ORDER — ESCITALOPRAM OXALATE 10 MG PO TABS: 10.0000 mg | ORAL_TABLET | Freq: Every day | ORAL | 2 refills | Status: DC

## 2017-01-21 NOTE — Telephone Encounter (Signed)
Patient is asking for a refill of escitalopram, Patient is requesting a 90 day refill. Confirmed pharmacy on file.

## 2017-01-21 NOTE — Telephone Encounter (Signed)
Medication refill request: Lexapro  Last AEX:  10-16-16  Next AEX: 10-16-17  Last MMG (if hormonal medication request): 12-06-16 WNL  Refill authorized: please advise

## 2017-01-21 NOTE — Telephone Encounter (Signed)
Refill completed. Thanks.

## 2017-05-16 ENCOUNTER — Encounter: Payer: Self-pay | Admitting: Certified Nurse Midwife

## 2017-09-27 ENCOUNTER — Other Ambulatory Visit: Payer: Self-pay | Admitting: *Deleted

## 2017-09-27 DIAGNOSIS — F419 Anxiety disorder, unspecified: Secondary | ICD-10-CM

## 2017-09-27 MED ORDER — ESCITALOPRAM OXALATE 10 MG PO TABS: 10.0000 mg | ORAL_TABLET | Freq: Every day | ORAL | 0 refills | Status: DC

## 2017-09-27 NOTE — Telephone Encounter (Signed)
Medication refill request: escitalopram 10 mg Last AEX: 10/16/16 DL   Next AEX: 10/17/17  Last MMG (if hormonal medication request): 12/06/16 BIRADS 1 negative  Refill authorized: 01/21/17 #90, 2RF. Today, please advise.

## 2017-10-17 ENCOUNTER — Ambulatory Visit: Payer: BC Managed Care – PPO | Admitting: Certified Nurse Midwife

## 2017-10-22 ENCOUNTER — Encounter: Payer: Self-pay | Admitting: Certified Nurse Midwife

## 2017-10-22 ENCOUNTER — Other Ambulatory Visit: Payer: Self-pay

## 2017-10-22 ENCOUNTER — Ambulatory Visit: Payer: BC Managed Care – PPO | Admitting: Certified Nurse Midwife

## 2017-10-22 VITALS — BP 100/62 | HR 68 | Resp 16 | Ht 62.75 in | Wt 111.0 lb

## 2017-10-22 DIAGNOSIS — Z01419 Encounter for gynecological examination (general) (routine) without abnormal findings: Secondary | ICD-10-CM | POA: Diagnosis not present

## 2017-10-22 DIAGNOSIS — Z0000 Encounter for general adult medical examination without abnormal findings: Secondary | ICD-10-CM

## 2017-10-22 DIAGNOSIS — B009 Herpesviral infection, unspecified: Secondary | ICD-10-CM

## 2017-10-22 LAB — POCT URINALYSIS DIPSTICK
BILIRUBIN UA: NEGATIVE
Blood, UA: NEGATIVE
Glucose, UA: NEGATIVE
Ketones, UA: NEGATIVE
Leukocytes, UA: NEGATIVE
Nitrite, UA: NEGATIVE
PH UA: 5 (ref 5.0–8.0)
Protein, UA: NEGATIVE
UROBILINOGEN UA: NEGATIVE U/dL — AB

## 2017-10-22 MED ORDER — VALACYCLOVIR HCL 1 G PO TABS: 1000.0000 mg | ORAL_TABLET | ORAL | 12 refills | Status: DC | PRN

## 2017-10-22 NOTE — Patient Instructions (Signed)

## 2017-10-22 NOTE — Progress Notes (Signed)
62 y.o. G3P3 Married  Caucasian Fe here for annual exam. Menopausal no HRT. Denies vaginal bleeding, some vaginal itching used OTC medication with good results. 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 Needs Rx update.Has decreased her Lexapro to 5 mg daily and doing well with this amount. She was without Rx for 5 days and decided to try lowering the dosage. "Feeling fine without issues at this point". Brought labs from PCP visit with Cholesterol still borderline, but overall profile good. Some constipation issues at times, working on water intake. No health issues today. Last daughter getting married soon!  Patient's last menstrual period was 09/19/2011.          Sexually active: Yes.    The current method of family planning is post menopausal status,btl.    Exercising: Yes.    walking Smoker:  no  Health Maintenance: Pap:  10-07-14 neg HPV HR neg, 10-17-16 neg History of Abnormal Pap: yes, just repeat MMG:  12-06-16 category b density birads 1:neg Self Breast exams: occ Colonoscopy:  2011 polyps f/u 10 yrs BMD:   2017 low bone mass, rt hip only  TDaP:  2016 Shingles: no Pneumonia: no Hep C and HIV: hep c neg 2018, HIV neg during pregnancy Labs: poct urine-neg   reports that she has never smoked. She has never used smokeless tobacco. She reports that she does not drink alcohol or use drugs.  Past Medical History:  Diagnosis Date  . Anxiety and depression   . Fibroid   . Shingles     Past Surgical History:  Procedure Laterality Date  . CESAREAN SECTION  1993  . ENDOMETRIAL BIOPSY     x2  . TONSILLECTOMY  1963  . TUBAL LIGATION  1993   BTL    Current Outpatient Medications  Medication Sig Dispense Refill  . ALPHA-LIPOIC ACID PO Take by mouth.    Marland Kitchen aspirin 81 MG tablet Take 81 mg by mouth daily.    . cetirizine (ZYRTEC) 10 MG tablet Take 10 mg by mouth as needed for allergies.    . Cholecalciferol (VITAMIN D PO) Take by mouth daily.    Marland Kitchen escitalopram (LEXAPRO) 10 MG  tablet Take 1 tablet (10 mg total) by mouth at bedtime. (Patient taking differently: Take 5 mg by mouth at bedtime. ) 90 tablet 0  . fluticasone (FLONASE) 50 MCG/ACT nasal spray as needed.    . Probiotic Product (PROBIOTIC PO) Take by mouth daily.    . valACYclovir (VALTREX) 1000 MG tablet as needed. Reported on 10/13/2015    . Vitamins-Lipotropics (B-50 PO) Take by mouth.     No current facility-administered medications for this visit.     Family History  Problem Relation Age of Onset  . Neuropathy Father        idiopathic    ROS:  Pertinent items are noted in HPI.  Otherwise, a comprehensive ROS was negative.  Exam:   BP 100/62   Pulse 68   Resp 16   Ht 5' 2.75" (1.594 m)   Wt 111 lb (50.3 kg)   LMP 09/19/2011   BMI 19.82 kg/m  Height: 5' 2.75" (159.4 cm) Ht Readings from Last 3 Encounters:  10/22/17 5' 2.75" (1.594 m)  10/16/16 5' 2.75" (1.594 m)  10/13/15 5' 2.75" (1.594 m)    General appearance: alert, cooperative and appears stated age Head: Normocephalic, without obvious abnormality, atraumatic Neck: no adenopathy, supple, symmetrical, trachea midline and thyroid normal to inspection and palpation Lungs: clear to  auscultation bilaterally Breasts: normal appearance, no masses or tenderness, No nipple retraction or dimpling, No nipple discharge or bleeding, No axillary or supraclavicular adenopathy Heart: regular rate and rhythm Abdomen: soft, non-tender; no masses,  no organomegaly Extremities: extremities normal, atraumatic, no cyanosis or edema Skin: Skin color, texture, turgor normal. No rashes or lesions Lymph nodes: Cervical, supraclavicular, and axillary nodes normal. No abnormal inguinal nodes palpated Neurologic: Grossly normal   Pelvic: External genitalia:  no lesions              Urethra:  normal appearing urethra with no masses, tenderness or lesions              Bartholin's and Skene's: normal                 Vagina: normal appearing vagina with  normal color and discharge, no lesions              Cervix: multiparous appearance, no cervical motion tenderness and no lesions              Pap taken: No. Bimanual Exam:  Uterus:  normal size, contour, position, consistency, mobility, non-tender              Adnexa: normal adnexa, no mass, fullness, tenderness and questionable stool palpated on right in several areas of adnexa, not tender, soft               Rectovaginal: Confirms               Anus:  normal sphincter tone, no lesions  Chaperone present: yes  A:  Well Woman with normal exam  Post menopausal no HRT  History of HSV 1 oral needs Rx Update  Anxiety/depression with Lexapro reduce dosage working well  Questionable right adnexal mass vs stool  BMD due patient will schedule with mammogram  P:   Reviewed health and wellness pertinent to exam  Aware of need to advise if vaginal bleeding  Rx Valtrex see order with instructions  Patient will advise when refill needed, due to using 1/2 tablet and 90 day supply  Discussed finding and patient to start on stool softener and will plan repeat visit in one week for recheck, if no change will do PUS. Patient agreeable, has busy week with school and retiring!  Pap smear: no  counseled on breast self exam, mammography screening, feminine hygiene, adequate intake of calcium and vitamin D, diet and exercise  return annually or prn  An After Visit Summary was printed and given to the patient.

## 2017-10-30 ENCOUNTER — Ambulatory Visit: Payer: BC Managed Care – PPO | Admitting: Certified Nurse Midwife

## 2017-10-30 ENCOUNTER — Encounter: Payer: Self-pay | Admitting: Certified Nurse Midwife

## 2017-10-30 ENCOUNTER — Other Ambulatory Visit: Payer: Self-pay | Admitting: Certified Nurse Midwife

## 2017-10-30 ENCOUNTER — Other Ambulatory Visit: Payer: Self-pay

## 2017-10-30 VITALS — BP 100/64 | HR 68 | Resp 16 | Ht 62.75 in | Wt 110.0 lb

## 2017-10-30 DIAGNOSIS — Z1231 Encounter for screening mammogram for malignant neoplasm of breast: Secondary | ICD-10-CM

## 2017-10-30 DIAGNOSIS — Z01419 Encounter for gynecological examination (general) (routine) without abnormal findings: Secondary | ICD-10-CM

## 2017-10-30 DIAGNOSIS — E2839 Other primary ovarian failure: Secondary | ICD-10-CM

## 2017-10-30 NOTE — Progress Notes (Signed)
62 y.o. Married Caucasian female G3P3 here for follow up of questionable mass vs stool noted on last pelvic exam on 10/22/17. Patient started Colace after visit and has several normal bowel movements with out problems. Denies any pelvic pain. Request order for BMD, has mammogram scheduled. No other issues today.   O: Healthy WD,WN female Affect: normal Skin:warm and dry Abdomen:soft, nontender, normal bowel sounds Pelvic exam:EXTERNAL GENITALIA: normal appearing vulva with no masses, tenderness or lesions VAGINA: no abnormal discharge or lesions CERVIX: no lesions or cervical motion tenderness UTERUS: normal size anteverted,no masses or tenderness ADNEXA: no masses palpable non tender, no additional masses noted from previous exam, stool was palpated  A:Normal pelvic exam Masses noted previous are not present after good bowel movements   P: Discussed findings of normal pelvic exam. Discussed routine use of Colace for better bowel health. Questions addressed. BMD order placed as same day as mammogram at [facility].   RV prn

## 2017-12-06 ENCOUNTER — Other Ambulatory Visit: Payer: Self-pay | Admitting: Certified Nurse Midwife

## 2017-12-06 DIAGNOSIS — E2839 Other primary ovarian failure: Secondary | ICD-10-CM

## 2017-12-09 ENCOUNTER — Ambulatory Visit (HOSPITAL_COMMUNITY): Payer: BC Managed Care – PPO

## 2017-12-16 ENCOUNTER — Ambulatory Visit (HOSPITAL_COMMUNITY)
Admission: RE | Admit: 2017-12-16 | Discharge: 2017-12-16 | Disposition: A | Discharge status: Home / Self Care | Payer: BC Managed Care – PPO | Source: Ambulatory Visit | Attending: Certified Nurse Midwife | Admitting: Certified Nurse Midwife

## 2017-12-16 DIAGNOSIS — Z1231 Encounter for screening mammogram for malignant neoplasm of breast: Secondary | ICD-10-CM | POA: Diagnosis present

## 2017-12-16 DIAGNOSIS — E2839 Other primary ovarian failure: Secondary | ICD-10-CM

## 2018-01-14 ENCOUNTER — Telehealth: Payer: Self-pay | Admitting: Certified Nurse Midwife

## 2018-01-14 NOTE — Telephone Encounter (Signed)
Message   This is a request for [employer].   Morgan Harrison, Insurance is not paying for my bone density/mammogram on 12/24/22 because it was not coded correctly. I was told to ask you to submit an adendum to remove the E28.39 code to a Z12.31 code which is for primary diagnosis. It should be secondary or removed and a preventative code needs to be applied. Is it possible for you to do this for me? I have never been charged for these procedures before and I received a huge bill this time. I appreciate anything that you can do to help me with this. I have been round and round on the phone with [facility] and [facility] trying to discover where the problem was.

## 2018-01-15 NOTE — Telephone Encounter (Signed)
Patient sent the following correspondence through [place]. Routing to [place] to assist patient with request.  Morgan Harrison, I think that this code may be what made the bone density look diagnostic and not preventative.     (You were seen on Monday December 16, 2017. The following issue was addressed: Estrogen deficiency.)

## 2018-04-07 ENCOUNTER — Other Ambulatory Visit: Payer: Self-pay | Admitting: Certified Nurse Midwife

## 2018-04-07 DIAGNOSIS — F419 Anxiety disorder, unspecified: Secondary | ICD-10-CM

## 2018-04-07 NOTE — Telephone Encounter (Signed)
Medication refill request: lexapro  Last AEX:  10/22/17 DL Next AEX: 10/30/18 DL Last MMG (if hormonal medication request): 12/16/17 BIRADS1:Neg  Refill authorized: 09/27/17 #90/0R. Today please advise.

## 2018-04-10 ENCOUNTER — Telehealth: Payer: Self-pay | Admitting: Certified Nurse Midwife

## 2018-04-10 NOTE — Telephone Encounter (Signed)
Call to patient. RN advised patient that per AEX notes on 10-22-17, patient stated she had decreased down to 5 mg of escitalopram that's why prescription was sent as 5 mg on 04-07-18. Patient states this is the start of a new prescription, but that she has been on 10 mg since last appointment. States. "I've got a lot going on and I'm not really ready to decrease." Patient states she would like to stay on 10 mg until her next AEX with Debbi. RN advised would review with Debbi and return call in regards to prescription. Patient agreeable.   Routing to provider for review.

## 2018-04-10 NOTE — Telephone Encounter (Signed)
Ok to use 10 mg daily, but she had been using this before.

## 2018-04-10 NOTE — Telephone Encounter (Signed)
Patient sent the following message through [place]. Routing to triage to assist patient with request.   Morgan Harrison, I just refilled my escitalopram perscription for 90 days. The pharmacist told me that my dosage had been changed from 10mg  to 5mg . I was given 45 10 mg pills and told to cut them in half and take one half daily. Is this correct? I do not remember [place] discussing that you wanted me to do this at my last appointment in May. Thank you!

## 2018-04-11 NOTE — Telephone Encounter (Signed)
Call to patient. Message given to patient as seen below from Debbi. Patient states had already picked up prescription sent in on 04-07-18. Patient will use that prescription and then call when needs a RF so new prescription for 10 mg can be sent in. Patient agreeable.   Routing to provider and will close encounter.

## 2018-05-19 DIAGNOSIS — F419 Anxiety disorder, unspecified: Secondary | ICD-10-CM

## 2018-05-19 MED ORDER — ESCITALOPRAM OXALATE 10 MG PO TABS: 10.0000 mg | ORAL_TABLET | Freq: Every day | ORAL | 1 refills | Status: DC

## 2018-05-19 NOTE — Telephone Encounter (Signed)
Refill sent as previously authorized by provider.  Patient notified via mychart.  Encounter to Melvia Heaps CNM  and will close.

## 2018-10-30 ENCOUNTER — Ambulatory Visit: Payer: BC Managed Care – PPO | Admitting: Certified Nurse Midwife

## 2018-11-03 ENCOUNTER — Other Ambulatory Visit (HOSPITAL_COMMUNITY): Payer: Self-pay | Admitting: Certified Nurse Midwife

## 2018-11-03 DIAGNOSIS — Z1231 Encounter for screening mammogram for malignant neoplasm of breast: Secondary | ICD-10-CM

## 2018-11-18 ENCOUNTER — Other Ambulatory Visit: Payer: Self-pay | Admitting: Certified Nurse Midwife

## 2018-11-18 DIAGNOSIS — F419 Anxiety disorder, unspecified: Secondary | ICD-10-CM

## 2018-11-18 NOTE — Telephone Encounter (Signed)
Medication refill request: Lexapro  Last AEX:  10-22-17 DL  Next AEX: 11-24-2018 Last MMG (if hormonal medication request): n/a Refill authorized: Today, please advise.   Medication pended for #30, 0RF as patient has aex 11-24-2018. Please refill if appropriate.

## 2018-11-24 ENCOUNTER — Other Ambulatory Visit: Payer: Self-pay

## 2018-11-24 ENCOUNTER — Encounter: Payer: Self-pay | Admitting: Certified Nurse Midwife

## 2018-11-24 ENCOUNTER — Ambulatory Visit: Payer: BC Managed Care – PPO | Admitting: Certified Nurse Midwife

## 2018-11-24 ENCOUNTER — Other Ambulatory Visit (HOSPITAL_COMMUNITY)
Admission: RE | Admit: 2018-11-24 | Discharge: 2018-11-24 | Disposition: A | Discharge status: Home / Self Care | Payer: BC Managed Care – PPO | Source: Ambulatory Visit | Attending: Certified Nurse Midwife | Admitting: Certified Nurse Midwife

## 2018-11-24 VITALS — BP 110/70 | HR 68 | Temp 97.6°F | Resp 16 | Ht 62.75 in | Wt 108.0 lb

## 2018-11-24 DIAGNOSIS — Z124 Encounter for screening for malignant neoplasm of cervix: Secondary | ICD-10-CM | POA: Insufficient documentation

## 2018-11-24 DIAGNOSIS — Z01419 Encounter for gynecological examination (general) (routine) without abnormal findings: Secondary | ICD-10-CM

## 2018-11-24 DIAGNOSIS — F419 Anxiety disorder, unspecified: Secondary | ICD-10-CM

## 2018-11-24 DIAGNOSIS — N951 Menopausal and female climacteric states: Secondary | ICD-10-CM

## 2018-11-24 DIAGNOSIS — B009 Herpesviral infection, unspecified: Secondary | ICD-10-CM

## 2018-11-24 MED ORDER — ESCITALOPRAM OXALATE 10 MG PO TABS: ORAL_TABLET | ORAL | 3 refills | Status: DC

## 2018-11-24 MED ORDER — VALACYCLOVIR HCL 1 G PO TABS: ORAL_TABLET | ORAL | 12 refills | Status: DC

## 2018-11-24 NOTE — Progress Notes (Signed)
63 y.o. G3P3 Married  Caucasian Fe here for annual exam. Menopausal, denies vaginal bleeding. Some vaginal dryness, not treating.[facility] PCP Dr. Nevada Crane for labs, has aex in 05/2019. Watching cholesterol and still exercising and taking Flaxseed. Lexapro working well for anxiety and desires continuance. Staying busy with moving out of large home and building new one. No health issues today. Has 2 new granddaughters!   Patient's last menstrual period was 09/19/2011.          Sexually active: Yes.    The current method of family planning is tubal ligation.    Exercising: Yes.    walking Smoker:  no  Review of Systems  Constitutional: Negative.   HENT: Negative.   Eyes: Negative.   Respiratory: Negative.   Cardiovascular: Negative.   Gastrointestinal: Negative.   Genitourinary: Negative.   Musculoskeletal: Negative.   Skin: Negative.   Neurological: Negative.   Endo/Heme/Allergies: Negative.   Psychiatric/Behavioral: Negative.     Health Maintenance: Pap:  10-17-16 neg History of Abnormal Pap: yes just a repeat MMG:  12-16-17 category c density birads 1:neg Self Breast exams: no Colonoscopy:  2011 polyps f/u 25yrs BMD:   2019 normal TDaP:  2016  Shingles: no Pneumonia: no Hep C and HIV: hep c neg 2018, HIV neg during pregnancy Labs: if needed   reports that she has never smoked. She has never used smokeless tobacco. She reports that she does not drink alcohol or use drugs.  Past Medical History:  Diagnosis Date  . Abnormal Pap smear of cervix    just repeat done  . Anxiety and depression   . Fibroid   . Shingles     Past Surgical History:  Procedure Laterality Date  . CESAREAN SECTION  1993  . ENDOMETRIAL BIOPSY     x2  . TONSILLECTOMY  1963  . TUBAL LIGATION  1993   BTL    Current Outpatient Medications  Medication Sig Dispense Refill  . ALPHA-LIPOIC ACID PO Take by mouth.    Marland Kitchen aspirin 81 MG tablet Take 81 mg by mouth daily.    . cetirizine (ZYRTEC) 10 MG tablet  Take 10 mg by mouth as needed for allergies.    . Cholecalciferol (VITAMIN D3 PO) Take 2,000 Int'l Units by mouth.    . escitalopram (LEXAPRO) 10 MG tablet TAKE (1) TABLET BY MOUTH ONCE DAILY. 30 tablet 0  . Flaxseed, Linseed, (FLAXSEED OIL PO) Take 1,000 mg by mouth.    . fluticasone (FLONASE) 50 MCG/ACT nasal spray as needed.    . Probiotic Product (PROBIOTIC PO) Take by mouth daily.    . valACYclovir (VALTREX) 1000 MG tablet Take 1 tablet (1,000 mg total) by mouth as needed. Reported on 10/13/2015 30 tablet 12  . Vitamins-Lipotropics (B-50 PO) Take by mouth.     No current facility-administered medications for this visit.     Family History  Problem Relation Age of Onset  . Neuropathy Father        idiopathic    ROS:  Pertinent items are noted in HPI.  Otherwise, a comprehensive ROS was negative.  Exam:   BP 110/70   Pulse 68   Temp 97.6 F (36.4 C) (Skin)   Resp 16   Ht 5' 2.75" (1.594 m)   Wt 108 lb (49 kg)   LMP 09/19/2011   BMI 19.28 kg/m  Height: 5' 2.75" (159.4 cm) Ht Readings from Last 3 Encounters:  11/24/18 5' 2.75" (1.594 m)  10/30/17 5' 2.75" (1.594 m)  10/22/17 5' 2.75" (1.594 m)    General appearance: alert, cooperative and appears stated age Head: Normocephalic, without obvious abnormality, atraumatic Neck: no adenopathy, supple, symmetrical, trachea midline and thyroid normal to inspection and palpation Lungs: clear to auscultation bilaterally Breasts: normal appearance, no masses or tenderness, No nipple retraction or dimpling, No nipple discharge or bleeding, No axillary or supraclavicular adenopathy Heart: regular rate and rhythm Abdomen: soft, non-tender; no masses,  no organomegaly Extremities: extremities normal, atraumatic, no cyanosis or edema Skin: Skin color, texture, turgor normal. No rashes or lesions Lymph nodes: Cervical, supraclavicular, and axillary nodes normal. No abnormal inguinal nodes palpated Neurologic: Grossly  normal   Pelvic: External genitalia:  no lesions, no atrophy              Urethra:  normal appearing urethra with no masses, tenderness or lesions              Bartholin's and Skene's: normal                 Vagina: normal appearing vagina with normal color and discharge, no lesions, dryness noted posterior only              Cervix: multiparous appearance, no cervical motion tenderness and no lesions              Pap taken: Yes.   Bimanual Exam:  Uterus:  normal size, contour, position, consistency, mobility, non-tender and anteverted              Adnexa: normal adnexa and no mass, fullness, tenderness               Rectovaginal: Confirms               Anus:  normal sphincter tone, no lesions  Chaperone present: yes  A:  Well Woman with normal exam  Menopausal no HRT  Anxiety Lexapro working well, desires continuance  History of oral HSV Valtrex works well    P:   Reviewed health and wellness pertinent to exam  Aware of need to advise if vaginal bleeding and discussed coconut oil for occasional vaginal dryness.  Discussed risks/benefits/warning signs of Lexapro and need to advise if she desires to stop.  Rx Lexapro see order with instructions  Rx Valtrex see order with instructions  Pap smear: yes   counseled on breast self exam, mammography screening, feminine hygiene, menopause, exercise, calcium intake and vitamin D  return annually or prn  An After Visit Summary was printed and given to the patient.

## 2018-11-26 LAB — CYTOLOGY - PAP
Diagnosis: NEGATIVE
HPV: NOT DETECTED

## 2019-01-02 ENCOUNTER — Ambulatory Visit (HOSPITAL_COMMUNITY): Payer: BC Managed Care – PPO

## 2019-01-08 ENCOUNTER — Other Ambulatory Visit: Payer: Self-pay

## 2019-01-08 ENCOUNTER — Ambulatory Visit (HOSPITAL_COMMUNITY)
Admission: RE | Admit: 2019-01-08 | Discharge: 2019-01-08 | Disposition: A | Discharge status: Home / Self Care | Payer: BC Managed Care – PPO | Source: Ambulatory Visit | Attending: Certified Nurse Midwife | Admitting: Certified Nurse Midwife

## 2019-01-08 DIAGNOSIS — Z1231 Encounter for screening mammogram for malignant neoplasm of breast: Secondary | ICD-10-CM | POA: Diagnosis present

## 2019-08-25 ENCOUNTER — Encounter: Payer: Self-pay | Admitting: Certified Nurse Midwife

## 2019-11-24 ENCOUNTER — Other Ambulatory Visit: Payer: Self-pay

## 2019-11-24 NOTE — Progress Notes (Signed)
64 y.o. G3P3 Married White or Caucasian Not Hispanic or Latino female here for annual exam.   Occasional vasomotor symptoms. No vaginal bleeding. Sexually active, uses a lubricant.     She is on lexapro 10 mg a day for anxiety and depression, doing great. Feels she can go off of it.   Patient's last menstrual period was 09/19/2011.          Sexually active: Yes.    The current method of family planning is post menopausal status.    Exercising: Yes.    Walking 3-5X a week Smoker:  no  Health Maintenance: Pap: 11/24/18 WNL HR HPV Neg,  10-17-16 neg History of abnormal Pap:  Yes just a repeat  MMG:  01/08/19 Density C Bi-rads 1 neg   BMD:   2019 normal Colonoscopy: 2011 polyps f/u 10 yrs, scheduled next week.  TDaP:  2016 Gardasil: NA   reports that she has never smoked. She has never used smokeless tobacco. She reports that she does not drink alcohol and does not use drugs. Retired as an Higher education careers adviser 2 years ago. Her parents are local, both 32, live independently. Dad with dementia. 3 daughters with grandchildren are local. 86, and 36 one year olds.   Past Medical History:  Diagnosis Date  . Abnormal Pap smear of cervix    just repeat done  . Anxiety and depression   . Fibroid   . Shingles     Past Surgical History:  Procedure Laterality Date  . CESAREAN SECTION  1993  . ENDOMETRIAL BIOPSY     x2  . TONSILLECTOMY  1963  . TUBAL LIGATION  1993   BTL    Current Outpatient Medications  Medication Sig Dispense Refill  . ALPHA-LIPOIC ACID PO Take by mouth.    Marland Kitchen aspirin 81 MG tablet Take 81 mg by mouth daily.    . cetirizine (ZYRTEC) 10 MG tablet Take 10 mg by mouth as needed for allergies.    . Cholecalciferol (VITAMIN D3 PO) Take 2,000 Int'l Units by mouth.    . escitalopram (LEXAPRO) 10 MG tablet One tablet daily by mouth 90 tablet 3  . Flaxseed, Linseed, (FLAXSEED OIL PO) Take 1,000 mg by mouth.    . fluticasone (FLONASE) 50 MCG/ACT nasal spray as needed.    . Probiotic  Product (PROBIOTIC PO) Take by mouth daily.    . valACYclovir (VALTREX) 1000 MG tablet Take one tablet as needed for oral outbreak 30 tablet 12  . Vitamins-Lipotropics (B-50 PO) Take by mouth.     No current facility-administered medications for this visit.    Family History  Problem Relation Age of Onset  . Neuropathy Father        idiopathic    Review of Systems  Genitourinary: Negative.   Neurological: Negative.   All other systems reviewed and are negative.   Exam:   LMP 09/19/2011   Weight change: @WEIGHTCHANGE @ Height:      Ht Readings from Last 3 Encounters:  11/24/18 5' 2.75" (1.594 m)  10/30/17 5' 2.75" (1.594 m)  10/22/17 5' 2.75" (1.594 m)    General appearance: alert, cooperative and appears stated age Head: Normocephalic, without obvious abnormality, atraumatic Neck: no adenopathy, supple, symmetrical, trachea midline and thyroid normal to inspection and palpation Lungs: clear to auscultation bilaterally Cardiovascular: regular rate and rhythm Breasts: normal appearance, no masses or tenderness Abdomen: soft, non-tender; non distended,  no masses,  no organomegaly Extremities: extremities normal, atraumatic, no cyanosis or edema Skin: Skin  color, texture, turgor normal. No rashes or lesions Lymph nodes: Cervical, supraclavicular, and axillary nodes normal. No abnormal inguinal nodes palpated Neurologic: Grossly normal   Pelvic: External genitalia:  no lesions              Urethra:  normal appearing urethra with no masses, tenderness or lesions              Bartholins and Skenes: normal                 Vagina: normal appearing vagina with normal color and discharge, no lesions              Cervix: no lesions               Bimanual Exam:  Uterus:  anteverted, mobile, not tender. Fullness off the left fundal portion of the uterus, ? adnexa vs uterus.              Adnexa: fullness in the left, not tender               Rectovaginal: Confirms                Anus:  normal sphincter tone, no lesions  Gae Dry chaperoned for the exam.  A:  Well Woman with normal exam  Adnexal fullness on left, h/o myoma. Last ultrasound reviewed the myoma was small. She does report constipation  P:   No pap this year  Discussed breast self exam  Discussed calcium and vit D intake  DEXA UTD and normal  Colonoscopy next week  Mammogram this week  Return for pelvic exam next week.

## 2019-11-25 ENCOUNTER — Other Ambulatory Visit (HOSPITAL_COMMUNITY): Payer: Self-pay | Admitting: Obstetrics and Gynecology

## 2019-11-25 ENCOUNTER — Ambulatory Visit: Payer: BC Managed Care – PPO | Admitting: Certified Nurse Midwife

## 2019-11-25 ENCOUNTER — Ambulatory Visit: Payer: BC Managed Care – PPO | Admitting: Obstetrics and Gynecology

## 2019-11-25 ENCOUNTER — Encounter: Payer: Self-pay | Admitting: Obstetrics and Gynecology

## 2019-11-25 VITALS — BP 122/64 | HR 70 | Temp 98.1°F | Ht 62.75 in | Wt 111.0 lb

## 2019-11-25 DIAGNOSIS — Z1231 Encounter for screening mammogram for malignant neoplasm of breast: Secondary | ICD-10-CM

## 2019-11-25 DIAGNOSIS — N949 Unspecified condition associated with female genital organs and menstrual cycle: Secondary | ICD-10-CM | POA: Diagnosis not present

## 2019-11-25 DIAGNOSIS — Z01419 Encounter for gynecological examination (general) (routine) without abnormal findings: Secondary | ICD-10-CM

## 2019-11-25 NOTE — Patient Instructions (Signed)
Valtrex 1000 mg, take 2 tablets every 12 hours x 2 doses.   EXERCISE AND DIET:  We recommended that you start or continue a regular exercise program for good health. Regular exercise means any activity that makes your heart beat faster and makes you sweat.  We recommend exercising at least 30 minutes per day at least 3 days a week, preferably 4 or 5.  We also recommend a diet low in fat and sugar.  Inactivity, poor dietary choices and obesity can cause diabetes, heart attack, stroke, and kidney damage, among others.    ALCOHOL AND SMOKING:  Women should limit their alcohol intake to no more than 7 drinks/beers/glasses of wine (combined, not each!) per week. Moderation of alcohol intake to this level decreases your risk of breast cancer and liver damage. And of course, no recreational drugs are part of a healthy lifestyle.  And absolutely no smoking or even second hand smoke. Most people know smoking can cause heart and lung diseases, but did you know it also contributes to weakening of your bones? Aging of your skin?  Yellowing of your teeth and nails?  CALCIUM AND VITAMIN D:  Adequate intake of calcium and Vitamin D are recommended.  The recommendations for exact amounts of these supplements seem to change often, but generally speaking 1,200 mg of calcium (between diet and supplement) and 800 units of Vitamin D per day seems prudent. Certain women may benefit from higher intake of Vitamin D.  If you are among these women, your doctor will have told you during your visit.    PAP SMEARS:  Pap smears, to check for cervical cancer or precancers,  have traditionally been done yearly, although recent scientific advances have shown that most women can have pap smears less often.  However, every woman still should have a physical exam from her gynecologist every year. It will include a breast check, inspection of the vulva and vagina to check for abnormal growths or skin changes, a visual exam of the cervix, and  then an exam to evaluate the size and shape of the uterus and ovaries.  And after 63 years of age, a rectal exam is indicated to check for rectal cancers. We will also provide age appropriate advice regarding health maintenance, like when you should have certain vaccines, screening for sexually transmitted diseases, bone density testing, colonoscopy, mammograms, etc.   MAMMOGRAMS:  All women over 49 years old should have a yearly mammogram. Many facilities now offer a "3D" mammogram, which may cost around $50 extra out of pocket. If possible,  we recommend you accept the option to have the 3D mammogram performed.  It both reduces the number of women who will be called back for extra views which then turn out to be normal, and it is better than the routine mammogram at detecting truly abnormal areas.    COLON CANCER SCREENING: Now recommend starting at age 7. At this time colonoscopy is not covered for routine screening until 50. There are take home tests that can be done between 45-49.   COLONOSCOPY:  Colonoscopy to screen for colon cancer is recommended for all women at age 53.  We know, you hate the idea of the prep.  We agree, BUT, having colon cancer and not knowing it is worse!!  Colon cancer so often starts as a polyp that can be seen and removed at colonscopy, which can quite literally save your life!  And if your first colonoscopy is normal and you have no  family history of colon cancer, most women don't have to have it again for 10 years.  Once every ten years, you can do something that may end up saving your life, right?  We will be happy to help you get it scheduled when you are ready.  Be sure to check your insurance coverage so you understand how much it will cost.  It may be covered as a preventative service at no cost, but you should check your particular policy.      Breast Self-Awareness Breast self-awareness means being familiar with how your breasts look and feel. It involves checking  your breasts regularly and reporting any changes to your health care provider. Practicing breast self-awareness is important. A change in your breasts can be a sign of a serious medical problem. Being familiar with how your breasts look and feel allows you to find any problems early, when treatment is more likely to be successful. All women should practice breast self-awareness, including women who have had breast implants. How to do a breast self-exam One way to learn what is normal for your breasts and whether your breasts are changing is to do a breast self-exam. To do a breast self-exam: Look for Changes  1. Remove all the clothing above your waist. 2. Stand in front of a mirror in a room with good lighting. 3. Put your hands on your hips. 4. Push your hands firmly downward. 5. Compare your breasts in the mirror. Look for differences between them (asymmetry), such as: ? Differences in shape. ? Differences in size. ? Puckers, dips, and bumps in one breast and not the other. 6. Look at each breast for changes in your skin, such as: ? Redness. ? Scaly areas. 7. Look for changes in your nipples, such as: ? Discharge. ? Bleeding. ? Dimpling. ? Redness. ? A change in position. Feel for Changes Carefully feel your breasts for lumps and changes. It is best to do this while lying on your back on the floor and again while sitting or standing in the shower or tub with soapy water on your skin. Feel each breast in the following way:  Place the arm on the side of the breast you are examining above your head.  Feel your breast with the other hand.  Start in the nipple area and make  inch (2 cm) overlapping circles to feel your breast. Use the pads of your three middle fingers to do this. Apply light pressure, then medium pressure, then firm pressure. The light pressure will allow you to feel the tissue closest to the skin. The medium pressure will allow you to feel the tissue that is a little  deeper. The firm pressure will allow you to feel the tissue close to the ribs.  Continue the overlapping circles, moving downward over the breast until you feel your ribs below your breast.  Move one finger-width toward the center of the body. Continue to use the  inch (2 cm) overlapping circles to feel your breast as you move slowly up toward your collarbone.  Continue the up and down exam using all three pressures until you reach your armpit.  Write Down What You Find  Write down what is normal for each breast and any changes that you find. Keep a written record with breast changes or normal findings for each breast. By writing this information down, you do not need to depend only on memory for size, tenderness, or location. Write down where you are in your  menstrual cycle, if you are still menstruating. If you are having trouble noticing differences in your breasts, do not get discouraged. With time you will become more familiar with the variations in your breasts and more comfortable with the exam. How often should I examine my breasts? Examine your breasts every month. If you are breastfeeding, the best time to examine your breasts is after a feeding or after using a breast pump. If you menstruate, the best time to examine your breasts is 5-7 days after your period is over. During your period, your breasts are lumpier, and it may be more difficult to notice changes. When should I see my health care provider? See your health care provider if you notice:  A change in shape or size of your breasts or nipples.  A change in the skin of your breast or nipples, such as a reddened or scaly area.  Unusual discharge from your nipples.  A lump or thick area that was not there before.  Pain in your breasts.  Anything that concerns you.

## 2019-12-03 ENCOUNTER — Other Ambulatory Visit: Payer: Self-pay

## 2019-12-03 ENCOUNTER — Encounter: Payer: Self-pay | Admitting: Obstetrics and Gynecology

## 2019-12-03 ENCOUNTER — Ambulatory Visit: Payer: BC Managed Care – PPO | Admitting: Obstetrics and Gynecology

## 2019-12-03 VITALS — BP 122/68 | HR 86 | Temp 98.4°F | Ht 62.75 in | Wt 109.6 lb

## 2019-12-03 DIAGNOSIS — Z0000 Encounter for general adult medical examination without abnormal findings: Secondary | ICD-10-CM

## 2019-12-03 DIAGNOSIS — Z01419 Encounter for gynecological examination (general) (routine) without abnormal findings: Secondary | ICD-10-CM

## 2019-12-03 NOTE — Progress Notes (Signed)
GYNECOLOGY  VISIT   HPI: 64 y.o.   Married White or Caucasian Not Hispanic or Latino  female   G3P3 with Patient's last menstrual period was 09/19/2011.   here for repeat pelvic exam. Last week at her annual exam she was noted to have adnexal mass, h/o constipation. She said she has emptied out since that visit. She also has a sore raised place on her right upper shoulder/neck  GYNECOLOGIC HISTORY: Patient's last menstrual period was 09/19/2011. Contraception:PMP Menopausal hormone therapy: none        OB History    Gravida  3   Para  3   Term      Preterm      AB      Living  3     SAB      TAB      Ectopic      Multiple      Live Births  3              Patient Active Problem List   Diagnosis Date Noted  . Anxiety state, unspecified 01/01/2013    Past Medical History:  Diagnosis Date  . Abnormal Pap smear of cervix    just repeat done  . Anxiety and depression   . Fibroid   . Shingles     Past Surgical History:  Procedure Laterality Date  . CESAREAN SECTION  1993  . ENDOMETRIAL BIOPSY     x2  . TONSILLECTOMY  1963  . TUBAL LIGATION  1993   BTL    Current Outpatient Medications  Medication Sig Dispense Refill  . ALPHA-LIPOIC ACID PO Take by mouth.    Marland Kitchen aspirin 81 MG tablet Take 81 mg by mouth daily.    . cetirizine (ZYRTEC) 10 MG tablet Take 10 mg by mouth as needed for allergies.    . Cholecalciferol (VITAMIN D3 PO) Take 2,000 Int'l Units by mouth.    . escitalopram (LEXAPRO) 10 MG tablet One tablet daily by mouth 90 tablet 3  . Flaxseed, Linseed, (FLAXSEED OIL PO) Take 1,000 mg by mouth.    . fluticasone (FLONASE) 50 MCG/ACT nasal spray as needed.    . Probiotic Product (PROBIOTIC PO) Take by mouth daily.    . valACYclovir (VALTREX) 1000 MG tablet Take one tablet as needed for oral outbreak 30 tablet 12  . Vitamins-Lipotropics (B-50 PO) Take by mouth.     No current facility-administered medications for this visit.     ALLERGIES:  Patient has no known allergies.  Family History  Problem Relation Age of Onset  . Neuropathy Father        idiopathic    Social History   Socioeconomic History  . Marital status: Married    Spouse name: Clare Gandy  . Number of children: 3  . Years of education: MA  . Highest education level: Not on file  Occupational History    Employer: [employer]    Comment: [employer] [address]  Tobacco Use  . Smoking status: Never Smoker  . Smokeless tobacco: Never Used  Substance and Sexual Activity  . Alcohol use: No  . Drug use: No  . Sexual activity: Yes    Partners: Male    Birth control/protection: Surgical, Post-menopausal    Comment: btl  Other Topics Concern  . Not on file  Social History Narrative   Patient lives at home with her spouse.   Caffeine Use: 1 cup daily   Social Determinants of Health   Financial  Resource Strain:   . Difficulty of Paying Living Expenses:   Food Insecurity:   . Worried About Charity fundraiser in the Last Year:   . Arboriculturist in the Last Year:   Transportation Needs:   . Film/video editor (Medical):   Marland Kitchen Lack of Transportation (Non-Medical):   Physical Activity:   . Days of Exercise per Week:   . Minutes of Exercise per Session:   Stress:   . Feeling of Stress :   Social Connections:   . Frequency of Communication with Friends and Family:   . Frequency of Social Gatherings with Friends and Family:   . Attends Religious Services:   . Active Member of Clubs or Organizations:   . Attends Archivist Meetings:   Marland Kitchen Marital Status:   Intimate Partner Violence:   . Fear of Current or Ex-Partner:   . Emotionally Abused:   Marland Kitchen Physically Abused:   . Sexually Abused:     Review of Systems  All other systems reviewed and are negative.   PHYSICAL EXAMINATION:    LMP 09/19/2011     General appearance: alert, cooperative and appears stated age Right upper back with <1cm tender bump, no erythema. Suspect boil  forming.  Pelvic: External genitalia:  no lesions              Urethra:  normal appearing urethra with no masses, tenderness or lesions              Bartholins and Skenes: normal                 Vagina: normal appearing vagina with normal color and discharge, no lesions              Cervix: no cervical motion tenderness              Bimanual Exam:  Uterus:  normal size, contour, position, consistency, mobility, non-tender              Adnexa: no mass, fullness, tenderness              Chaperone was present for exam.  ASSESSMENT Adnexal fullness resolved Suspect boil on her upper right back.    PLAN Routine f/u Use warm compresses on her shoulder. F/U with primary if doesn't resolve.

## 2020-01-11 ENCOUNTER — Ambulatory Visit (HOSPITAL_COMMUNITY)
Admission: RE | Admit: 2020-01-11 | Discharge: 2020-01-11 | Disposition: A | Discharge status: Home / Self Care | Payer: BC Managed Care – PPO | Source: Ambulatory Visit | Attending: Obstetrics and Gynecology | Admitting: Obstetrics and Gynecology

## 2020-01-11 ENCOUNTER — Other Ambulatory Visit: Payer: Self-pay

## 2020-01-11 DIAGNOSIS — Z1231 Encounter for screening mammogram for malignant neoplasm of breast: Secondary | ICD-10-CM | POA: Diagnosis not present

## 2020-02-03 ENCOUNTER — Encounter: Payer: Self-pay | Admitting: Obstetrics and Gynecology

## 2020-02-03 ENCOUNTER — Other Ambulatory Visit: Payer: Self-pay

## 2020-02-03 DIAGNOSIS — F419 Anxiety disorder, unspecified: Secondary | ICD-10-CM

## 2020-02-03 NOTE — Telephone Encounter (Signed)
Medication refill request: Lexapro 10mg   Last AEX:  11/25/19 Next AEX: not yet scheduled Last MMG (if hormonal medication request): 01/11/20 neg  Refill authorized: 90/0

## 2020-02-03 NOTE — Telephone Encounter (Signed)
At the time of her annual exam in June, she was planning of weaning off of the lexapro. Can you please check with the patient if she does need a refill? If so send in 90 with 2 refills. If not cancel script.

## 2020-02-04 ENCOUNTER — Other Ambulatory Visit: Payer: Self-pay

## 2020-02-04 ENCOUNTER — Other Ambulatory Visit: Payer: Self-pay | Admitting: Obstetrics and Gynecology

## 2020-02-04 DIAGNOSIS — F419 Anxiety disorder, unspecified: Secondary | ICD-10-CM

## 2020-02-04 MED ORDER — ESCITALOPRAM OXALATE 10 MG PO TABS: ORAL_TABLET | ORAL | 2 refills | Status: DC

## 2020-02-04 NOTE — Telephone Encounter (Signed)
error 

## 2020-02-04 NOTE — Telephone Encounter (Signed)
Script sent  

## 2020-02-05 ENCOUNTER — Ambulatory Visit (HOSPITAL_COMMUNITY)
Admission: RE | Admit: 2020-02-05 | Discharge: 2020-02-05 | Disposition: A | Discharge status: Home / Self Care | Payer: BC Managed Care – PPO | Source: Ambulatory Visit | Attending: Adult Health Nurse Practitioner | Admitting: Adult Health Nurse Practitioner

## 2020-02-05 ENCOUNTER — Other Ambulatory Visit (HOSPITAL_COMMUNITY): Payer: Self-pay | Admitting: Adult Health Nurse Practitioner

## 2020-02-05 ENCOUNTER — Other Ambulatory Visit: Payer: Self-pay

## 2020-02-05 DIAGNOSIS — M25571 Pain in right ankle and joints of right foot: Secondary | ICD-10-CM

## 2020-03-31 ENCOUNTER — Ambulatory Visit: Payer: BC Managed Care – PPO | Attending: Internal Medicine

## 2020-03-31 DIAGNOSIS — Z23 Encounter for immunization: Secondary | ICD-10-CM

## 2020-03-31 NOTE — Progress Notes (Signed)
   Covid-19 Vaccination [facility]  Name:  Morgan Harrison    MRN: [id number] DOB: 09-19-1955  03/31/2020  [name] was observed post Covid-19 immunization for 15 minutes without incident. She was provided with Vaccine Information Sheet and instruction to access the V-Safe system.   [name] was instructed to call 911 with any severe reactions post vaccine: Marland Kitchen Difficulty breathing  . Swelling of face and throat  . A fast heartbeat  . A bad rash all over body  . Dizziness and weakness

## 2020-07-29 ENCOUNTER — Encounter: Payer: Self-pay | Admitting: Cardiology

## 2020-07-29 ENCOUNTER — Other Ambulatory Visit: Payer: Self-pay

## 2020-07-29 ENCOUNTER — Ambulatory Visit (INDEPENDENT_AMBULATORY_CARE_PROVIDER_SITE_OTHER): Payer: BC Managed Care – PPO | Admitting: Cardiology

## 2020-07-29 ENCOUNTER — Encounter: Payer: Self-pay | Admitting: *Deleted

## 2020-07-29 VITALS — BP 112/62 | HR 77 | Ht 63.0 in | Wt 114.0 lb

## 2020-07-29 DIAGNOSIS — R079 Chest pain, unspecified: Secondary | ICD-10-CM

## 2020-07-29 NOTE — Progress Notes (Signed)
Clinical Summary Ms. Fraley is a 65 y.o.female seen today as a new consult, referred by Dr Nevada Crane for the following medical problems.  1. Chest pain - burning pain mid chest, bilateral arms - moderate to higher levels of exertion. - no other associated symptoms - not positional - no relation to food eating - lasts a few seconds, typically resolves. 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 Abnormal Pap smear of cervix    just repeat done  . Anxiety and depression   . Fibroid   . Shingles      No Known Allergies   Current Outpatient Medications  Medication Sig Dispense Refill  . ALPHA-LIPOIC ACID PO Take by mouth.    Marland Kitchen aspirin 81 MG tablet Take 81 mg by mouth daily.    . cetirizine (ZYRTEC) 10 MG tablet Take 10 mg by mouth as needed for allergies.    . Cholecalciferol (VITAMIN D3 PO) Take 2,000 Int'l Units by mouth.    . escitalopram (LEXAPRO) 10 MG tablet One tablet daily by mouth 90 tablet 2  . Flaxseed, Linseed, (FLAXSEED OIL PO) Take 1,000 mg by mouth.    . fluticasone (FLONASE) 50 MCG/ACT nasal spray as needed.    . Probiotic Product (PROBIOTIC PO) Take by mouth daily.    . valACYclovir (VALTREX) 1000 MG tablet Take one tablet as needed for oral outbreak 30 tablet 12  . Vitamins-Lipotropics (B-50 PO) Take by mouth.     No current facility-administered medications for this visit.     Past Surgical History:  Procedure Laterality Date  . CESAREAN SECTION  1993  . ENDOMETRIAL BIOPSY     x2  . TONSILLECTOMY  1963  . TUBAL LIGATION  1993   BTL     No Known Allergies    Family History  Problem Relation Age of Onset  . Neuropathy Father        idiopathic     Social History Ms. Baumgart reports that she has never smoked. She has never used  smokeless tobacco. Ms. Skeet reports no history of alcohol use.   Review of Systems CONSTITUTIONAL: No weight loss, fever, chills, weakness or fatigue.  HEENT: Eyes: No visual loss, blurred vision, double vision or yellow sclerae.No hearing loss, sneezing, congestion, runny nose or sore throat.  SKIN: No rash or itching.  CARDIOVASCULAR: per hpi RESPIRATORY: No shortness of breath, cough or sputum.  GASTROINTESTINAL: No anorexia, nausea, vomiting or diarrhea. No abdominal pain or blood.  GENITOURINARY: No burning on urination, no polyuria NEUROLOGICAL: No headache, dizziness, syncope, paralysis, ataxia, numbness or tingling in the extremities. No change in bowel or bladder control.  MUSCULOSKELETAL: No muscle, back pain, joint pain or stiffness.  LYMPHATICS: No enlarged nodes. No history of splenectomy.  PSYCHIATRIC: No history of depression or anxiety.  ENDOCRINOLOGIC: No reports of sweating, cold or heat intolerance. No polyuria or polydipsia.  Marland Kitchen   Physical Examination Today's Vitals   07/29/20 1320  BP: 112/62  Pulse: 77  SpO2: 98%  Weight: 114 lb (51.7 kg)  Height: 5\' 3"  (1.6 m)   Body mass index is 20.19 kg/m.  Gen: resting comfortably, no acute distress HEENT: no scleral icterus, pupils equal round and reactive, no palptable cervical adenopathy,  CV:  RRR, no m/r/g, no jvd Resp: Clear to auscultation bilaterally GI: abdomen is soft, non-tender, non-distended, normal bowel sounds, no hepatosplenomegaly MSK: extremities are warm, no edema.  Skin: warm, no rash Neuro:  no focal deficits Psych: appropriate affect      Assessment and Plan  1. Chest pain - unclear etiology. Limited CAD risk factors, but symptoms have a regular exertional component to them - plan for GXT to further evaluate EKG today shows NSR  F/u pending GXT, if negative then just f/u as needed      Arnoldo Lenis, M.D.

## 2020-07-29 NOTE — Addendum Note (Signed)
Addended by: Julian Hy T on: 07/29/2020 03:53 PM   Modules accepted: Orders

## 2020-07-29 NOTE — Patient Instructions (Signed)
Your physician recommends that you schedule a follow-up appointment in: PENDING WITH DR [facility]  Your physician recommends that you continue on your current medications as directed. Please refer to the Current Medication list given to you today.  Your physician has requested that you have an exercise tolerance test. For further information please visit [email or web address]. Please also follow instruction sheet, as given.  Thank you for choosing [facility]!!

## 2020-08-03 ENCOUNTER — Other Ambulatory Visit: Payer: Self-pay

## 2020-08-03 ENCOUNTER — Other Ambulatory Visit (HOSPITAL_COMMUNITY)
Admission: RE | Admit: 2020-08-03 | Discharge: 2020-08-03 | Disposition: A | Discharge status: Home / Self Care | Payer: BC Managed Care – PPO | Source: Ambulatory Visit | Attending: Cardiology | Admitting: Cardiology

## 2020-08-03 DIAGNOSIS — Z01812 Encounter for preprocedural laboratory examination: Secondary | ICD-10-CM | POA: Insufficient documentation

## 2020-08-03 DIAGNOSIS — Z20822 Contact with and (suspected) exposure to covid-19: Secondary | ICD-10-CM | POA: Diagnosis not present

## 2020-08-03 LAB — SARS CORONAVIRUS 2 (TAT 6-24 HRS): SARS Coronavirus 2: NEGATIVE

## 2020-08-05 ENCOUNTER — Other Ambulatory Visit: Payer: Self-pay

## 2020-08-05 ENCOUNTER — Ambulatory Visit (HOSPITAL_COMMUNITY)
Admission: RE | Admit: 2020-08-05 | Discharge: 2020-08-05 | Disposition: A | Discharge status: Home / Self Care | Payer: BC Managed Care – PPO | Source: Ambulatory Visit | Attending: Cardiology | Admitting: Cardiology

## 2020-08-05 DIAGNOSIS — R079 Chest pain, unspecified: Secondary | ICD-10-CM | POA: Insufficient documentation

## 2020-08-05 LAB — EXERCISE TOLERANCE TEST
Estimated workload: 7 METS
Exercise duration (min): 6 min
Exercise duration (sec): 0 s
MPHR: 157 {beats}/min
Peak HR: 144 {beats}/min
Percent HR: 91 %
RPE: 13
Rest HR: 75 {beats}/min

## 2020-08-10 ENCOUNTER — Telehealth: Payer: Self-pay | Admitting: *Deleted

## 2020-08-10 DIAGNOSIS — R9431 Abnormal electrocardiogram [ECG] [EKG]: Secondary | ICD-10-CM

## 2020-08-10 NOTE — Telephone Encounter (Signed)
-----   Message from Arnoldo Lenis, MD sent at 08/08/2020  6:57 PM EST ----- Exercise stress test showed some borderline changes, but nothing definitive. Would recommend we get additional information with a more detailed stress test, please order a lexiscan for chest pain.   Zandra Abts MD

## 2020-08-10 NOTE — Telephone Encounter (Signed)
Pt voiced understanding and agreeable to [place] - orders placed and will forward to schedulers

## 2020-08-11 ENCOUNTER — Telehealth: Payer: Self-pay | Admitting: *Deleted

## 2020-08-11 NOTE — Telephone Encounter (Signed)
-----   Message from Desma Paganini sent at 08/11/2020 10:13 AM EST ----- I scheduled her for 08/23/2020 arrive at 745 to Radiology   ----- Message ----- From: Massie Maroon, CMA Sent: 08/10/2020  10:05 AM EST To: Desma Paganini  Can you please schedule lexiscan for this pt for abnormal exercise stress test - she wants earliers appt available, I can call her with date/time, thank you    [facility]

## 2020-08-11 NOTE — Telephone Encounter (Signed)
Left detailed message of date/time/location

## 2020-08-23 ENCOUNTER — Encounter (HOSPITAL_COMMUNITY)
Admission: RE | Admit: 2020-08-23 | Discharge: 2020-08-23 | Disposition: A | Discharge status: Home / Self Care | Payer: BC Managed Care – PPO | Source: Ambulatory Visit | Attending: Cardiology | Admitting: Cardiology

## 2020-08-23 ENCOUNTER — Ambulatory Visit (HOSPITAL_COMMUNITY)
Admission: RE | Admit: 2020-08-23 | Discharge: 2020-08-23 | Disposition: A | Discharge status: Home / Self Care | Payer: BC Managed Care – PPO | Source: Ambulatory Visit | Attending: Cardiology | Admitting: Cardiology

## 2020-08-23 ENCOUNTER — Encounter (HOSPITAL_COMMUNITY): Payer: Self-pay

## 2020-08-23 DIAGNOSIS — R9431 Abnormal electrocardiogram [ECG] [EKG]: Secondary | ICD-10-CM | POA: Diagnosis present

## 2020-08-23 MED ORDER — REGADENOSON 0.4 MG/5ML IV SOLN
INTRAVENOUS | Status: AC
  Administered 2020-08-23: 0.4 mg via INTRAVENOUS
  Filled 2020-08-23: qty 5

## 2020-08-23 MED ORDER — TECHNETIUM TC 99M TETROFOSMIN IV KIT
10.0000 | PACK | Freq: Once | INTRAVENOUS | Status: AC | PRN
  Administered 2020-08-23: 10 via INTRAVENOUS

## 2020-08-23 MED ORDER — TECHNETIUM TC 99M TETROFOSMIN IV KIT
30.0000 | PACK | Freq: Once | INTRAVENOUS | Status: AC | PRN
  Administered 2020-08-23: 29.7 via INTRAVENOUS

## 2020-08-23 MED ORDER — SODIUM CHLORIDE FLUSH 0.9 % IV SOLN
INTRAVENOUS | Status: AC
  Administered 2020-08-23: 10 mL via INTRAVENOUS
  Filled 2020-08-23: qty 10

## 2020-08-24 LAB — NM MYOCAR MULTI W/SPECT W/WALL MOTION / EF
LV dias vol: 57 mL (ref 46–106)
LV sys vol: 18 mL
Peak HR: 101 {beats}/min
RATE: 0.28
Rest HR: 56 {beats}/min
SDS: 0
SRS: 1
SSS: 1
TID: 1.06

## 2020-08-30 ENCOUNTER — Telehealth: Payer: Self-pay | Admitting: *Deleted

## 2020-08-30 NOTE — Telephone Encounter (Signed)
-----   Message from Arnoldo Lenis, MD sent at 08/29/2020  4:05 PM EDT ----- Normal stress test, no evidence of any significant blockages. Needs to f/u with pcp to consider noncardiac causes of symptoms, f/u with [place] 6 months  J BrancH MD

## 2020-08-30 NOTE — Telephone Encounter (Signed)
Patient informed. Copy sent to PCP Did not want to set up 6 mth f/u at this time

## 2020-09-08 ENCOUNTER — Ambulatory Visit: Payer: BC Managed Care – PPO | Admitting: Nutrition

## 2020-10-12 ENCOUNTER — Encounter: Payer: Self-pay | Admitting: Nutrition

## 2020-10-12 ENCOUNTER — Encounter: Payer: BC Managed Care – PPO | Attending: Internal Medicine | Admitting: Nutrition

## 2020-10-12 ENCOUNTER — Other Ambulatory Visit: Payer: Self-pay

## 2020-10-12 VITALS — Ht 63.0 in | Wt 113.0 lb

## 2020-10-12 DIAGNOSIS — E782 Mixed hyperlipidemia: Secondary | ICD-10-CM | POA: Insufficient documentation

## 2020-10-12 NOTE — Patient Instructions (Signed)
Goals  Aim for 25 g of fiber per day Cut out ice cream and saturated fat and dairy sources. Keep walking Drink 8-12 oz of water with metimucil before bed nightly. Avoid processed foods. Increase plant based foods.

## 2020-10-12 NOTE — Progress Notes (Signed)
  Medical Nutrition Therapy:  Appt start time: 1300 end time:  1400.  Assessment:  Primary concerns today: Hyperlipidemia..  E78.0.   Preferred Learning Style:  No preference indicated   Learning Readiness:   Ready  Change in progress  MEDICATIONS: See  Chart. Wants to improve lipids with diet and exercise and not go on statins.   DIETARY INTAKE:    24-hr recall:  B) 1/2 grapefruit with granola/grapenut or honey bunches of oats with strawberries,coffee flavored creamer Misc: dark chocolate Lunch: yogurt and fruit and pb crackers, Dr Malachi Bonds Snack; nuts or cheese Dinner: Broccoli, chicken and rice soup Ice cream before bed   Usual physical activity: Walks 20 minutes most days  Estimated energy needs: 1500-1800 calories 200  g carbohydrates 135 g protein 50 g fat  Progress Towards Goal(s):  In progress.   Nutritional Diagnosis:  NB-1.1 Food and nutrition-related knowledge deficit As related to hyperlipidemia.  As evidenced by Lipids.    Intervention:  Low Cholesterol Diet, reading food labels, increasing fiber to 25 g per day.    Goals  Aim for 25 g of fiber per day Cut out ice cream and saturated fat and dairy sources. Keep walking Drink 8-12 oz of water with metimucil before bed nightly. Avoid processed foods. Increase plant based foods.   Teaching Method Utilized:  Visual Auditory Hands on  Handouts given during visit include:  Low Cholesterol Diet    Barriers to learning/adherence to lifestyle change: none  Demonstrated degree of understanding via:  Teach Back   Monitoring/Evaluation:  Dietary intake, exercise, , and body weight prn.

## 2020-10-20 ENCOUNTER — Encounter: Payer: Self-pay | Admitting: Nutrition

## 2020-11-14 ENCOUNTER — Other Ambulatory Visit: Payer: Self-pay | Admitting: Obstetrics and Gynecology

## 2020-11-14 DIAGNOSIS — F419 Anxiety disorder, unspecified: Secondary | ICD-10-CM

## 2020-11-14 MED ORDER — ESCITALOPRAM OXALATE 10 MG PO TABS: ORAL_TABLET | ORAL | 0 refills | Status: DC

## 2020-11-14 NOTE — Telephone Encounter (Signed)
Patient annual exam scheduled on 12/01/20  Note attached to Rx from patient "Patient comment: I would like to be able to get 3 months at a time if possible."

## 2020-11-14 NOTE — Telephone Encounter (Signed)
Dr.Jertson patient , annual exam scheduled on 12/01/20

## 2020-11-30 NOTE — Progress Notes (Signed)
65 y.o. G3P3 Married White or Caucasian Not Hispanic or Latino female here for annual exam.  No vaginal bleeding. Some vaginal dryness, uses a lubricant with some help (still uncomfortable).   She recently had Covid, still doesn't have all of her energy back.     Patient's last menstrual period was 09/19/2011.          Sexually active: Yes.    The current method of family planning is post menopausal status.    Exercising: Yes.     Walking  Smoker:  no  Health Maintenance: Pap:   11/24/18 WNL HR HPV Neg,  10-17-16 neg History of abnormal Pap:  yes just a repeat pap was done.  MMG:  01/11/20 density B Bi-rads 1 neg  BMD:   12/16/17 normal  Colonoscopy: 12/02/2019 f/u 5 years  TDaP:  10/07/14  Gardasil: NA   reports that she has never smoked. She has never used smokeless tobacco. 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 Her mom is local, live independently. Dad died last year. 3 daughters, 3 grandchildren all local, 2 of her daughters are pregnant. 25 year old and two year olds.     Past Medical History:  Diagnosis Date   Abnormal Pap smear of cervix    just repeat done   Anxiety and depression    Fibroid    Shingles     Past Surgical History:  Procedure Laterality Date   CESAREAN SECTION  1993   ENDOMETRIAL BIOPSY     x2   [place]   BTL    Current Outpatient Medications  Medication Sig Dispense Refill   aspirin 81 MG tablet Take 81 mg by mouth daily.     B Complex Vitamins (VITAMIN B COMPLEX PO) Take by mouth.     Cholecalciferol (VITAMIN D3 PO) Take 4,000 Int'l Units by mouth.     escitalopram (LEXAPRO) 10 MG tablet TAKE (1) TABLET BY MOUTH ONCE DAILY. 90 tablet 0   fluticasone (FLONASE) 50 MCG/ACT nasal spray as needed.     Probiotic Product (PROBIOTIC PO) Take by mouth daily.     rosuvastatin (CRESTOR) 5 MG tablet Take by mouth.     No current  facility-administered medications for this visit.    Family History  Problem Relation Age of Onset   Neuropathy Father        idiopathic    Review of Systems  Exam:   BP 118/62   Pulse 71   Ht 5\' 3"  (1.6 m)   Wt 112 lb 12.8 oz (51.2 kg)   LMP 09/19/2011   BMI 19.98 kg/m   Weight change: @WEIGHTCHANGE @ Height:   Height: 5\' 3"  (160 cm)  Ht Readings from Last 3 Encounters:  12/01/20 5\' 3"  (1.6 m)  10/12/20 5\' 3"  (1.6 m)  07/29/20 5\' 3"  (1.6 m)    General appearance: alert, cooperative and appears stated age Head: Normocephalic, without obvious abnormality, atraumatic Neck: no adenopathy, supple, symmetrical, trachea midline and thyroid normal to inspection and palpation Lungs: clear to auscultation bilaterally Cardiovascular: regular rate and rhythm Breasts: normal appearance, no masses or tenderness Abdomen: soft, non-tender; non distended,  no masses,  no organomegaly Extremities: extremities normal, atraumatic, no cyanosis or edema Skin: Skin color, texture, turgor normal. No rashes or lesions Lymph nodes: Cervical, supraclavicular, and axillary nodes normal. No abnormal inguinal nodes palpated Neurologic: Grossly normal  Pelvic: External genitalia:  no lesions              Urethra:  normal appearing urethra with no masses, tenderness or lesions              Bartholins and Skenes: normal                 Vagina: atrophic appearing vagina with normal color and discharge, no lesions              Cervix: no lesions               Bimanual Exam:  Uterus:  normal size, contour, position, consistency, mobility, non-tender              Adnexa: no mass, fullness, tenderness               Rectovaginal: Confirms               Anus:  normal sphincter tone, no lesions  Gae Dry chaperoned for the exam.  1. Well woman exam Discussed breast self exam Discussed calcium and vit D intake Mammogram in 8/22 DEXA normal Colonoscopy UTD  2. Dyspareunia, female Even with  vaginal lubrication - Estradiol 10 MCG TABS vaginal tablet; Place one tablet vaginally qhs x 1 week, then change to 2 x a week  Dispense: 24 tablet; Refill: 4  3. Vaginal atrophy Discussed vaginal estrogen, she would like to try it.  - Estradiol 10 MCG TABS vaginal tablet; Place one tablet vaginally qhs x 1 week, then change to 2 x a week  Dispense: 24 tablet; Refill: 4   4. History of depression Well controlled - escitalopram (LEXAPRO) 10 MG tablet; TAKE (1) TABLET BY MOUTH ONCE DAILY.  Dispense: 90 tablet; Refill: 3

## 2020-12-01 ENCOUNTER — Other Ambulatory Visit: Payer: Self-pay

## 2020-12-01 ENCOUNTER — Encounter: Payer: Self-pay | Admitting: Obstetrics and Gynecology

## 2020-12-01 ENCOUNTER — Ambulatory Visit (INDEPENDENT_AMBULATORY_CARE_PROVIDER_SITE_OTHER): Payer: BC Managed Care – PPO | Admitting: Obstetrics and Gynecology

## 2020-12-01 VITALS — BP 118/62 | HR 71 | Ht 63.0 in | Wt 112.8 lb

## 2020-12-01 DIAGNOSIS — N941 Unspecified dyspareunia: Secondary | ICD-10-CM

## 2020-12-01 DIAGNOSIS — Z8659 Personal history of other mental and behavioral disorders: Secondary | ICD-10-CM | POA: Diagnosis not present

## 2020-12-01 DIAGNOSIS — Z01419 Encounter for gynecological examination (general) (routine) without abnormal findings: Secondary | ICD-10-CM

## 2020-12-01 DIAGNOSIS — N952 Postmenopausal atrophic vaginitis: Secondary | ICD-10-CM | POA: Diagnosis not present

## 2020-12-01 MED ORDER — ESTRADIOL 10 MCG VA TABS: ORAL_TABLET | VAGINAL | 4 refills | Status: DC

## 2020-12-01 MED ORDER — ESCITALOPRAM OXALATE 10 MG PO TABS: ORAL_TABLET | ORAL | 3 refills | Status: DC

## 2020-12-01 NOTE — Patient Instructions (Signed)
Lubricants: Uberlube, Astroglide (silicon based).  Atrophic Vaginitis  Atrophic vaginitis is a condition in which the tissues that line the vagina become dry and thin. This condition is most common in women who have stopped having regular menstrual periods (are in menopause). This usually starts when a woman is 66 to 64 years old. That is the timewhen a woman's estrogen levels begin to decrease. Estrogen is a female hormone. It helps to keep the tissues of the vagina moist. It stimulates the vagina to produce a clear fluid that lubricates the vagina for sex. This fluid also protects the vagina from infection. Lack of estrogen can cause the lining of the vagina to get thinner and dryer. The vagina may also shrink in size. It may become less elastic. Atrophic vaginitis tends toget worse over time as a woman's estrogen level drops. What are the causes? This condition is caused by the normal drop in estrogen that happens around thetime of menopause. What increases the risk? Certain conditions or situations may lower a woman's estrogen level, leading to a higher risk for atrophic vaginitis. You are more likely to develop this condition if: You are taking medicines that block estrogen. You have had your ovaries removed. You are being treated for cancer with radiation or medicines (chemotherapy). You have given birth or are breastfeeding. You are older than age 47. You smoke. What are the signs or symptoms? Symptoms of this condition include: Pain, soreness, a feeling of pressure, or bleeding during sex (dyspareunia). Vaginal burning, irritation, or itching. Pain or bleeding when a speculum is used in a vaginal exam. Having burning pain while urinating. Vaginal discharge. In some cases, there are no symptoms. How is this diagnosed? This condition is diagnosed based on your medical history and a physical exam. This will include a pelvic exam that checks the vaginal tissues. Though rare, you may also  have other tests, including: A urine test. A test that checks the acid balance in your vagina (acid balance test). How is this treated? Treatment for this condition depends on how severe your symptoms are. Treatment may include: Using an over-the-counter vaginal lubricant before sex. Using a long-acting vaginal moisturizer. Using low-dose estrogen for moderate to severe symptoms that do not respond to other treatments. Options include creams, tablets, and inserts (vaginal rings). Before you use a vaginal estrogen, tell your health care provider if you have a history of: Breast cancer. Endometrial cancer. Blood clots. If you are not sexually active and your symptoms are very mild, you may notneed treatment. Follow these instructions at home: Medicines Take over-the-counter and prescription medicines only as told by your health care provider. Do not use herbal or alternative medicines unless your health care provider says that you can. Use over-the-counter creams, lubricants, or moisturizers for dryness only as told by your health care provider. General instructions If your atrophic vaginitis is caused by menopause, discuss all of your menopause symptoms and treatment options with your health care provider. Do not douche. Do not use products that can make your vagina dry. These include: Scented feminine sprays. Scented tampons. Scented soaps. Vaginal sex can help to improve blood flow and elasticity of vaginal tissue. If you choose to have sex and it hurts, try using a water-soluble lubricant or moisturizer right before having sex. Contact a health care provider if: Your discharge looks different than normal. Your vagina has an unusual smell. You have new symptoms. Your symptoms do not improve with treatment. Your symptoms get worse. Summary Atrophic vaginitis is  a condition in which the tissues that line the vagina become dry and thin. It is most common in women who have stopped having  regular menstrual periods (are in menopause). Treatment options include using vaginal lubricants and low-dose vaginal estrogen. Contact a health care provider if your vagina has an unusual smell, or if your symptoms get worse or do not improve after treatment. This information is not intended to replace advice given to you by your health care provider. Make sure you discuss any questions you have with your healthcare provider. Document Revised: 11/19/2019 Document Reviewed: 11/19/2019 Elsevier Patient Education  [place]   We recommended that you start or continue a regular exercise program for good health. Physical activity is anything that gets your body moving, some is better than none. The CDC recommends 150 minutes per week of Moderate-Intensity Aerobic Activity and 2 or more days of Muscle Strengthening Activity.  Benefits of exercise are limitless: helps weight loss/weight maintenance, improves mood and energy, helps with depression and anxiety, improves sleep, tones and strengthens muscles, improves balance, improves bone density, protects from chronic conditions such as heart disease, high blood pressure and diabetes and so much more. To learn more visit: WhyNotPoker.uy  DIET: Good nutrition starts with a healthy diet of fruits, vegetables, whole grains, and lean protein sources. Drink plenty of water for hydration. Minimize empty calories, sodium, sweets. For more information about dietary recommendations visit: GeekRegister.com.ee and http://schaefer-mitchell.com/  ALCOHOL:  Women should limit their alcohol intake to no more than 7 drinks/beers/glasses of wine (combined, not each!) per week. Moderation of alcohol intake to this level decreases your risk of breast cancer and liver damage.  If you are concerned that you may have a problem, or your friends have told you they are concerned  about your drinking, there are many resources to help. A well-known program that is free, effective, and available to all people all over the nation is Alcoholics Anonymous.  Check out this site to learn more: BlockTaxes.se   CALCIUM AND VITAMIN D:  Adequate intake of calcium and Vitamin D are recommended for bone health.  You should be getting between 1000-1200 mg of calcium and 800 units of Vitamin D daily between diet and supplements  PAP SMEARS:  Pap smears, to check for cervical cancer or precancers,  have traditionally been done yearly, scientific advances have shown that most women can have pap smears less often.  However, every woman still should have a physical exam from her gynecologist every year. It will include a breast check, inspection of the vulva and vagina to check for abnormal growths or skin changes, a visual exam of the cervix, and then an exam to evaluate the size and shape of the uterus and ovaries. We will also provide age appropriate advice regarding health maintenance, like when you should have certain vaccines, screening for sexually transmitted diseases, bone density testing, colonoscopy, mammograms, etc.   MAMMOGRAMS:  All women over 39 years old should have a routine mammogram.   COLON CANCER SCREENING: Now recommend starting at age 96. At this time colonoscopy is not covered for routine screening until 50. There are take home tests that can be done between 45-49.   COLONOSCOPY:  Colonoscopy to screen for colon cancer is recommended for all women at age 65.  We know, you hate the idea of the prep.  We agree, BUT, having colon cancer and not knowing it is worse!!  Colon cancer so often starts as a  polyp that can be seen and removed at colonscopy, which can quite literally save your life!  And if your first colonoscopy is normal and you have no family history of colon cancer, most women don't have to have it again for 10 years.  Once every ten years, you can do something  that may end up saving your life, right?  We will be happy to help you get it scheduled when you are ready.  Be sure to check your insurance coverage so you understand how much it will cost.  It may be covered as a preventative service at no cost, but you should check your particular policy.      Breast Self-Awareness Breast self-awareness means being familiar with how your breasts look and feel. It involves checking your breasts regularly and reporting any changes to your health care provider. Practicing breast self-awareness is important. A change in your breasts can be a sign of a serious medical problem. Being familiar with how your breasts look and feel allows you to find any problems early, when treatment is more likely to be successful. All women should practice breast self-awareness, including women who have had breast implants. How to do a breast self-exam One way to learn what is normal for your breasts and whether your breasts are changing is to do a breast self-exam. To do a breast self-exam: Look for Changes  Remove all the clothing above your waist. Stand in front of a mirror in a room with good lighting. Put your hands on your hips. Push your hands firmly downward. Compare your breasts in the mirror. Look for differences between them (asymmetry), such as: Differences in shape. Differences in size. Puckers, dips, and bumps in one breast and not the other. Look at each breast for changes in your skin, such as: Redness. Scaly areas. Look for changes in your nipples, such as: Discharge. Bleeding. Dimpling. Redness. A change in position. Feel for Changes Carefully feel your breasts for lumps and changes. It is best to do this while lying on your back on the floor and again while sitting or standing in the shower or tub with soapy water on your skin. Feel each breast in the following way: Place the arm on the side of the breast you are examining above your head. Feel your  breast with the other hand. Start in the nipple area and make  inch (2 cm) overlapping circles to feel your breast. Use the pads of your three middle fingers to do this. Apply light pressure, then medium pressure, then firm pressure. The light pressure will allow you to feel the tissue closest to the skin. The medium pressure will allow you to feel the tissue that is a little deeper. The firm pressure will allow you to feel the tissue close to the ribs. Continue the overlapping circles, moving downward over the breast until you feel your ribs below your breast. Move one finger-width toward the center of the body. Continue to use the  inch (2 cm) overlapping circles to feel your breast as you move slowly up toward your collarbone. Continue the up and down exam using all three pressures until you reach your armpit.  Write Down What You Find  Write down what is normal for each breast and any changes that you find. Keep a written record with breast changes or normal findings for each breast. By writing this information down, you do not need to depend only on memory for size, tenderness, or location. Write down  where you are in your menstrual cycle, if you are still menstruating. If you are having trouble noticing differences in your breasts, do not get discouraged. With time you will become more familiar with the variations in your breasts and more comfortable with the exam. How often should I examine my breasts? Examine your breasts every month. If you are breastfeeding, the best time to examine your breasts is after a feeding or after using a breast pump. If you menstruate, the best time to examine your breasts is 5-7 days after your period is over. During your period, your breasts are lumpier, and it may be more difficult to notice changes. When should I see my health care provider? See your health care provider if you notice: A change in shape or size of your breasts or nipples. A change in the skin  of your breast or nipples, such as a reddened or scaly area. Unusual discharge from your nipples. A lump or thick area that was not there before. Pain in your breasts. Anything that concerns you.

## 2021-01-20 DIAGNOSIS — H11003 Unspecified pterygium of eye, bilateral: Secondary | ICD-10-CM | POA: Insufficient documentation

## 2021-02-10 ENCOUNTER — Other Ambulatory Visit (HOSPITAL_COMMUNITY): Payer: Self-pay | Admitting: Obstetrics and Gynecology

## 2021-02-10 DIAGNOSIS — Z1231 Encounter for screening mammogram for malignant neoplasm of breast: Secondary | ICD-10-CM

## 2021-02-23 ENCOUNTER — Ambulatory Visit (HOSPITAL_COMMUNITY)
Admission: RE | Admit: 2021-02-23 | Discharge: 2021-02-23 | Disposition: A | Discharge status: Home / Self Care | Payer: BC Managed Care – PPO | Source: Ambulatory Visit | Attending: Obstetrics and Gynecology | Admitting: Obstetrics and Gynecology

## 2021-02-23 ENCOUNTER — Other Ambulatory Visit: Payer: Self-pay

## 2021-02-23 DIAGNOSIS — Z1231 Encounter for screening mammogram for malignant neoplasm of breast: Secondary | ICD-10-CM | POA: Insufficient documentation

## 2021-10-19 ENCOUNTER — Telehealth: Payer: Self-pay | Admitting: *Deleted

## 2021-10-19 NOTE — Telephone Encounter (Signed)
Received fax from [facility] that estradiol 10 mcg tablet needs PA. PA done via cover my meds however it was for Vagifem not generic estradiol 10 mcg tablet. PA response returned from [facility] stating " Available without authorization."   I called [facility] and spoke with pharmacist and she said the patient has new insurance and her received a "kickback" saying it needed PA as well. He told me that patient picked up Rx and insurance paid for it and to just hold on he will contact me if anything additional needs to be done.

## 2021-10-27 ENCOUNTER — Encounter: Payer: Self-pay | Admitting: Obstetrics and Gynecology

## 2021-11-01 DIAGNOSIS — H11002 Unspecified pterygium of left eye: Secondary | ICD-10-CM | POA: Insufficient documentation

## 2021-11-23 NOTE — Progress Notes (Deleted)
66 y.o. G3P3 Married White or Caucasian Not Hispanic or Latino female here for annual exam.      Patient's last menstrual period was 09/19/2011.          Sexually active: {yes no:314532}  The current method of family planning is {contraception:315051}.    Exercising: {yes no:314532}  {types:19826} Smoker:  {YES NO:22349}  Health Maintenance: Pap: 11/24/18 WNL HR HPV Neg,  10-17-16 neg History of abnormal Pap:  yes just a repeat pap was done.  MMG:  02/28/21 density C Bi-rads 1 neg  BMD:   12/16/17 normal Colonoscopy: 12/02/2019 f/u 5 years  TDaP:   10/07/14  Gardasil: none   reports that she has never smoked. She has never used smokeless tobacco. She reports that she does not drink alcohol and does not use drugs.  Past Medical History:  Diagnosis Date   Abnormal Pap smear of cervix    just repeat done   Anxiety and depression    Fibroid    Shingles     Past Surgical History:  Procedure Laterality Date   CESAREAN SECTION  1993   ENDOMETRIAL BIOPSY     x2   [place]   BTL    Current Outpatient Medications  Medication Sig Dispense Refill   aspirin 81 MG tablet Take 81 mg by mouth daily.     B Complex Vitamins (VITAMIN B COMPLEX PO) Take by mouth.     Cholecalciferol (VITAMIN D3 PO) Take 4,000 Int'l Units by mouth.     escitalopram (LEXAPRO) 10 MG tablet TAKE (1) TABLET BY MOUTH ONCE DAILY. 90 tablet 3   Estradiol 10 MCG TABS vaginal tablet Place one tablet vaginally qhs x 1 week, then change to 2 x a week 24 tablet 4   fluticasone (FLONASE) 50 MCG/ACT nasal spray as needed.     Probiotic Product (PROBIOTIC PO) Take by mouth daily.     rosuvastatin (CRESTOR) 5 MG tablet Take by mouth.     No current facility-administered medications for this visit.    Family History  Problem Relation Age of Onset   Neuropathy Father        idiopathic    Review of Systems  Exam:   LMP 09/19/2011   Weight change: '@WEIGHTCHANGE'$ @ Height:      Ht  Readings from Last 3 Encounters:  12/01/20 '5\' 3"'$  (1.6 m)  10/12/20 '5\' 3"'$  (1.6 m)  07/29/20 '5\' 3"'$  (1.6 m)    General appearance: alert, cooperative and appears stated age Head: Normocephalic, without obvious abnormality, atraumatic Neck: no adenopathy, supple, symmetrical, trachea midline and thyroid {CHL AMB PHY EX THYROID NORM DEFAULT:[phone]::"normal to inspection and palpation"} Lungs: clear to auscultation bilaterally Cardiovascular: regular rate and rhythm Breasts: {Exam; breast:13139::"normal appearance, no masses or tenderness"} Abdomen: soft, non-tender; non distended,  no masses,  no organomegaly Extremities: extremities normal, atraumatic, no cyanosis or edema Skin: Skin color, texture, turgor normal. No rashes or lesions Lymph nodes: Cervical, supraclavicular, and axillary nodes normal. No abnormal inguinal nodes palpated Neurologic: Grossly normal   Pelvic: External genitalia:  no lesions              Urethra:  normal appearing urethra with no masses, tenderness or lesions              Bartholins and Skenes: normal                 Vagina: normal appearing vagina with normal color and discharge, no  lesions              Cervix: {CHL AMB PHY EX CERVIX NORM DEFAULT:[phone]::"no lesions"}               Bimanual Exam:  Uterus:  {CHL AMB PHY EX UTERUS NORM DEFAULT:[phone]::"normal size, contour, position, consistency, mobility, non-tender"}              Adnexa: {CHL AMB PHY EX ADNEXA NO MASS DEFAULT:[phone]::"no mass, fullness, tenderness"}               Rectovaginal: Confirms               Anus:  normal sphincter tone, no lesions  *** chaperoned for the exam.  A:  Well Woman with normal exam  P:

## 2021-12-06 ENCOUNTER — Ambulatory Visit: Payer: Self-pay | Admitting: Obstetrics and Gynecology

## 2021-12-08 NOTE — Progress Notes (Unsigned)
66 y.o. G3P3 Married White or Caucasian Not Hispanic or Latino female here for annual exam.  No vaginal bleeding.  No bowel or bladder c/o. Uses metamucil, which works well.   H/O vaginal atrophy and dyspareunia, helped with vaginal estrogen and lubricant. 75% better, still slight discomfort.   H/o depression, controlled on lexapro    Patient's last menstrual period was 09/19/2011.          Sexually active: Yes.    The current method of family planning is tubal ligation and post menopausal status.    Exercising: Yes.     Walking and pickle ball  Smoker:  no  Health Maintenance: Pap:  11/24/18 WNL HR HPV Neg,  10-17-16 neg History of abnormal Pap:  yes just a repeat pap was done.  MMG:  02/28/21 density C Bi-rads 1 neg  BMD:  12/16/17 normal  Colonoscopy: 12/02/2019 f/u 5 years  TDaP:  10/07/14  Gardasil: NA   reports that she has never smoked. She has never used smokeless tobacco. She reports that she does not drink alcohol and does not use drugs. 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 3 daughters, 5 grandchildren. One daughter has 2 foster kids (39 and 81), no biological children.  Past Medical History:  Diagnosis Date   Abnormal Pap smear of cervix    just repeat done   Anxiety and depression    Fibroid    Shingles     Past Surgical History:  Procedure Laterality Date   CESAREAN SECTION  1993   ENDOMETRIAL BIOPSY     x2   [place]   BTL    Current Outpatient Medications  Medication Sig Dispense Refill   aspirin 81 MG tablet Take 81 mg by mouth daily.     B Complex Vitamins (VITAMIN B COMPLEX PO) Take by mouth.     Cholecalciferol (VITAMIN D3 PO) Take 4,000 Int'l Units by mouth.     escitalopram (LEXAPRO) 10 MG tablet TAKE (1) TABLET BY MOUTH ONCE DAILY. 90 tablet 3   Estradiol 10 MCG TABS vaginal tablet Place one tablet vaginally qhs x 1 week, then change to 2 x a week 24 tablet 4   rosuvastatin  (CRESTOR) 5 MG tablet Take by mouth.     No current facility-administered medications for this visit.    Family History  Problem Relation Age of Onset   Neuropathy Father        idiopathic    Review of Systems  All other systems reviewed and are negative.   Exam:   BP 122/68   Pulse 72   Ht '5\' 3"'$  (1.6 m)   Wt 115 lb (52.2 kg)   LMP 09/19/2011   SpO2 100%   BMI 20.37 kg/m   Weight change: '@WEIGHTCHANGE'$ @ Height:   Height: '5\' 3"'$  (160 cm)  Ht Readings from Last 3 Encounters:  12/14/21 '5\' 3"'$  (1.6 m)  12/01/20 '5\' 3"'$  (1.6 m)  10/12/20 '5\' 3"'$  (1.6 m)    General appearance: alert, cooperative and appears stated age Head: Normocephalic, without obvious abnormality, atraumatic Neck: no adenopathy, supple, symmetrical, trachea midline and thyroid normal to inspection and palpation Breasts: normal appearance, no masses or tenderness Abdomen: soft, non-tender; non distended,  no masses,  no organomegaly Extremities: extremities normal, atraumatic, no cyanosis or edema Skin: Skin color, texture, turgor normal. No rashes or lesions Lymph nodes: Cervical, supraclavicular, and axillary nodes normal. No abnormal inguinal  nodes palpated Neurologic: Grossly normal   Pelvic: External genitalia:  no lesions              Urethra:  normal appearing urethra with no masses, tenderness or lesions              Bartholins and Skenes: normal                 Vagina: mildly atrophic appearing vagina with normal color and discharge, no lesions. Able to insert 2 fingers vaginally without pain              Cervix: no lesions               Bimanual Exam:  Uterus:  normal size, contour, position, consistency, mobility, non-tender              Adnexa: no mass, fullness, tenderness               Rectovaginal: Confirms               Anus:  normal sphincter tone, no lesions  Gae Dry chaperoned for the exam.  1. Encounter for breast and pelvic examination Discussed breast self exam Discussed calcium  and vit D intake No pap this year Mammogram, colonoscopy and DEXA are UTD Labs scanned from primary  2. Dyspareunia, female Discussed lubricant, option of vaginal dilators.  - Estradiol 10 MCG TABS vaginal tablet; Place one tablet vaginally 2 x a week at hs  Dispense: 24 tablet; Refill: 4  3. Vaginal atrophy - Estradiol 10 MCG TABS vaginal tablet; Place one tablet vaginally 2 x a week at hs  Dispense: 24 tablet; Refill: 4  4. History of depression Control - escitalopram (LEXAPRO) 10 MG tablet; TAKE (1) TABLET BY MOUTH ONCE DAILY.  Dispense: 90 tablet; Refill: 4

## 2021-12-14 ENCOUNTER — Ambulatory Visit (INDEPENDENT_AMBULATORY_CARE_PROVIDER_SITE_OTHER): Payer: Medicare PPO | Admitting: Obstetrics and Gynecology

## 2021-12-14 ENCOUNTER — Encounter: Payer: Self-pay | Admitting: Obstetrics and Gynecology

## 2021-12-14 VITALS — BP 122/68 | HR 72 | Ht 63.0 in | Wt 115.0 lb

## 2021-12-14 DIAGNOSIS — Z8659 Personal history of other mental and behavioral disorders: Secondary | ICD-10-CM

## 2021-12-14 DIAGNOSIS — N952 Postmenopausal atrophic vaginitis: Secondary | ICD-10-CM

## 2021-12-14 DIAGNOSIS — Z01419 Encounter for gynecological examination (general) (routine) without abnormal findings: Secondary | ICD-10-CM | POA: Diagnosis not present

## 2021-12-14 DIAGNOSIS — N941 Unspecified dyspareunia: Secondary | ICD-10-CM | POA: Diagnosis not present

## 2021-12-14 MED ORDER — ESCITALOPRAM OXALATE 10 MG PO TABS: ORAL_TABLET | ORAL | 4 refills | Status: DC

## 2021-12-14 MED ORDER — ESTRADIOL 10 MCG VA TABS: ORAL_TABLET | VAGINAL | 4 refills | Status: DC

## 2021-12-14 NOTE — Patient Instructions (Signed)

## 2022-01-16 ENCOUNTER — Other Ambulatory Visit (HOSPITAL_COMMUNITY): Payer: Self-pay | Admitting: Obstetrics and Gynecology

## 2022-01-16 DIAGNOSIS — Z1231 Encounter for screening mammogram for malignant neoplasm of breast: Secondary | ICD-10-CM

## 2022-01-22 ENCOUNTER — Telehealth: Payer: Self-pay

## 2022-01-22 NOTE — Telephone Encounter (Signed)
Patient called because only received a mos Rx from pharmacy and asked if anyway she could get 3 mos at a time. I called her back and spoke with her and let her know 90 days supply was sent in. I told her oftentimes when pharmacy only fills 30 days supply it may be because her insurance has mail order plan that they prefer she get her long term Rx's from. I advised her that her pharmacy may be able to tell her why but she could check with her pharmacy to see if mail order available for long term Rx.  She will call back and let us know if we need to send Rx elsewhere.

## 2022-02-28 ENCOUNTER — Ambulatory Visit (HOSPITAL_COMMUNITY): Payer: Self-pay

## 2022-03-07 ENCOUNTER — Ambulatory Visit (HOSPITAL_COMMUNITY): Payer: Self-pay

## 2022-03-07 ENCOUNTER — Ambulatory Visit (HOSPITAL_COMMUNITY)
Admission: RE | Admit: 2022-03-07 | Discharge: 2022-03-07 | Disposition: A | Discharge status: Home / Self Care | Payer: Medicare PPO | Source: Ambulatory Visit | Attending: Obstetrics and Gynecology | Admitting: Obstetrics and Gynecology

## 2022-03-07 DIAGNOSIS — Z1231 Encounter for screening mammogram for malignant neoplasm of breast: Secondary | ICD-10-CM | POA: Diagnosis present

## 2022-03-11 DIAGNOSIS — Z9889 Other specified postprocedural states: Secondary | ICD-10-CM | POA: Insufficient documentation

## 2022-06-06 DIAGNOSIS — M9903 Segmental and somatic dysfunction of lumbar region: Secondary | ICD-10-CM | POA: Diagnosis not present

## 2022-06-06 DIAGNOSIS — M542 Cervicalgia: Secondary | ICD-10-CM | POA: Diagnosis not present

## 2022-06-06 DIAGNOSIS — M9901 Segmental and somatic dysfunction of cervical region: Secondary | ICD-10-CM | POA: Diagnosis not present

## 2022-06-06 DIAGNOSIS — M9902 Segmental and somatic dysfunction of thoracic region: Secondary | ICD-10-CM | POA: Diagnosis not present

## 2022-06-30 ENCOUNTER — Emergency Department (HOSPITAL_COMMUNITY)
Admission: EM | Admit: 2022-06-30 | Discharge: 2022-07-01 | Disposition: A | Discharge status: Home / Self Care | Payer: Medicare PPO | Attending: Emergency Medicine | Admitting: Emergency Medicine

## 2022-06-30 DIAGNOSIS — Z7982 Long term (current) use of aspirin: Secondary | ICD-10-CM | POA: Insufficient documentation

## 2022-06-30 DIAGNOSIS — R519 Headache, unspecified: Secondary | ICD-10-CM | POA: Diagnosis present

## 2022-06-30 DIAGNOSIS — U071 COVID-19: Secondary | ICD-10-CM | POA: Diagnosis not present

## 2022-06-30 LAB — CBC WITH DIFFERENTIAL/PLATELET
Abs Immature Granulocytes: 0.01 10*3/uL (ref 0.00–0.07)
Basophils Absolute: 0 10*3/uL (ref 0.0–0.1)
Basophils Relative: 0 %
Eosinophils Absolute: 0 10*3/uL (ref 0.0–0.5)
Eosinophils Relative: 1 %
HCT: 36.1 % (ref 36.0–46.0)
Hemoglobin: 12.3 g/dL (ref 12.0–15.0)
Immature Granulocytes: 0 %
Lymphocytes Relative: 7 %
Lymphs Abs: 0.4 10*3/uL — ABNORMAL LOW (ref 0.7–4.0)
MCH: 28.5 pg (ref 26.0–34.0)
MCHC: 34.1 g/dL (ref 30.0–36.0)
MCV: 83.8 fL (ref 80.0–100.0)
Monocytes Absolute: 0.4 10*3/uL (ref 0.1–1.0)
Monocytes Relative: 9 %
Neutro Abs: 4 10*3/uL (ref 1.7–7.7)
Neutrophils Relative %: 83 %
Platelets: 227 10*3/uL (ref 150–400)
RBC: 4.31 MIL/uL (ref 3.87–5.11)
RDW: 12.9 % (ref 11.5–15.5)
WBC: 4.8 10*3/uL (ref 4.0–10.5)
nRBC: 0 % (ref 0.0–0.2)

## 2022-06-30 LAB — COMPREHENSIVE METABOLIC PANEL
ALT: 23 U/L (ref 0–44)
AST: 24 U/L (ref 15–41)
Albumin: 4.1 g/dL (ref 3.5–5.0)
Alkaline Phosphatase: 69 U/L (ref 38–126)
Anion gap: 8 (ref 5–15)
BUN: 13 mg/dL (ref 8–23)
CO2: 24 mmol/L (ref 22–32)
Calcium: 9.1 mg/dL (ref 8.9–10.3)
Chloride: 98 mmol/L (ref 98–111)
Creatinine, Ser: 0.49 mg/dL (ref 0.44–1.00)
GFR, Estimated: >60 mL/min (ref >60)
Glucose, Bld: 125 mg/dL — ABNORMAL HIGH (ref 70–99)
Potassium: 3.9 mmol/L (ref 3.5–5.1)
Sodium: 130 mmol/L — ABNORMAL LOW (ref 135–145)
Total Bilirubin: 0.6 mg/dL (ref 0.3–1.2)
Total Protein: 7.3 g/dL (ref 6.5–8.1)

## 2022-06-30 LAB — RESP PANEL BY RT-PCR (RSV, FLU A&B, COVID)  RVPGX2
Influenza A by PCR: NEGATIVE
Influenza B by PCR: NEGATIVE
Resp Syncytial Virus by PCR: NEGATIVE
SARS Coronavirus 2 by RT PCR: POSITIVE — AB

## 2022-06-30 MED ORDER — ONDANSETRON HCL 4 MG PO TABS: 4.0000 mg | ORAL_TABLET | Freq: Four times a day (QID) | ORAL | 0 refills | Status: DC | PRN

## 2022-06-30 MED ORDER — ACETAMINOPHEN 325 MG PO TABS
650.0000 mg | ORAL_TABLET | Freq: Once | ORAL | Status: AC
  Administered 2022-06-30: 650 mg via ORAL
  Filled 2022-06-30: qty 2

## 2022-06-30 MED ORDER — ONDANSETRON HCL 4 MG/2ML IJ SOLN
4.0000 mg | Freq: Once | INTRAMUSCULAR | Status: AC
  Administered 2022-06-30: 4 mg via INTRAVENOUS
  Filled 2022-06-30: qty 2

## 2022-06-30 MED ORDER — NIRMATRELVIR/RITONAVIR (PAXLOVID)TABLET: 3.0000 | ORAL_TABLET | Freq: Two times a day (BID) | ORAL | 0 refills | Status: AC

## 2022-06-30 MED ORDER — SODIUM CHLORIDE 0.9 % IV BOLUS
1000.0000 mL | Freq: Once | INTRAVENOUS | Status: AC
  Administered 2022-06-30: 1000 mL via INTRAVENOUS

## 2022-06-30 NOTE — ED Triage Notes (Addendum)
Pt to ED c/o flu sx that started yesterday, pt c/o generalized body aches, nausea, vomiting, diarrhea, HA . Reports recent travel via air to [place].

## 2022-06-30 NOTE — Discharge Instructions (Signed)
Return to the ED with any new or worsening signs or symptoms Please pick up prescription for Paxlovid I have sent in Please pick up antinausea medication Zofran Please read attached guides concerning COVID-19 as well as quarantine and isolation You will need to quarantine yourself for 5 days from symptom onset.  Once you have been fever free or symptom-free for 24 hours you may end isolation.

## 2022-06-30 NOTE — ED Provider Notes (Signed)
[facility] Provider Note   CSN: [id number] Arrival date & time: 06/30/22  2042     History  Chief Complaint  Patient presents with   flu sx    Morgan Harrison is a 67 y.o. female with medical history of anxiety and depression, fibroid, shingles.  Patient presents to ED for evaluation of URI symptoms.  Patient reports that beginning yesterday she developed headache, nausea and vomiting, diarrhea, body aches and chills.  Patient reports that she recently traveled to [place], flew there so she is unsure of sick contacts.  Patient denies any fevers, sore throat, cough, chest pain or shortness of breath, abdominal pain.  Patient reports that she has taken some home medications however her symptoms always return.  HPI     Home Medications Prior to Admission medications   Medication Sig Start Date End Date Taking? Authorizing Provider  nirmatrelvir/ritonavir (PAXLOVID) 20 x 150 MG & 10 x '100MG'$  TABS Take 3 tablets by mouth 2 (two) times daily for 5 days. Patient GFR is greater than 60. Take nirmatrelvir (150 mg) two tablets twice daily for 5 days and ritonavir (100 mg) one tablet twice daily for 5 days. 06/30/22 07/05/22 Yes Azucena Cecil, PA-C  ondansetron (ZOFRAN) 4 MG tablet Take 1 tablet (4 mg total) by mouth every 6 (six) hours as needed for nausea or vomiting. 06/30/22  Yes Azucena Cecil, PA-C  aspirin 81 MG tablet Take 81 mg by mouth daily.    [provider]  B Complex Vitamins (VITAMIN B COMPLEX PO) Take by mouth.    [provider]  Cholecalciferol (VITAMIN D3 PO) Take 4,000 Int'l Units by mouth.    [provider]  escitalopram (LEXAPRO) 10 MG tablet TAKE (1) TABLET BY MOUTH ONCE DAILY. 12/14/21   Salvadore Dom, MD  Estradiol 10 MCG TABS vaginal tablet Place one tablet vaginally 2 x a week at hs 12/14/21   Salvadore Dom, MD  rosuvastatin (CRESTOR) 5 MG tablet Take by mouth. 11/30/20    [provider]      Allergies    Patient has no known allergies.    Review of Systems   Review of Systems  Constitutional:  Positive for chills. Negative for fever.  HENT:  Negative for sore throat.   Respiratory:  Negative for cough and shortness of breath.   Cardiovascular:  Negative for chest pain.  Gastrointestinal:  Positive for diarrhea, nausea and vomiting. Negative for abdominal pain.  Musculoskeletal:  Positive for myalgias.  Neurological:  Positive for headaches.  All other systems reviewed and are negative.   Physical Exam Updated Vital Signs BP 138/73   Pulse 81   Temp 99.2 F (37.3 C) (Oral)   Resp 18   Ht '5\' 3"'$  (1.6 m)   Wt 50.8 kg   LMP 09/19/2011   SpO2 97%   BMI 19.84 kg/m  Physical Exam Vitals and nursing note reviewed.  Constitutional:      General: She is not in acute distress.    Appearance: She is well-developed.  HENT:     Head: Normocephalic and atraumatic.     Mouth/Throat:     Mouth: Mucous membranes are moist.     Pharynx: Oropharynx is clear. Posterior oropharyngeal erythema present. No oropharyngeal exudate.  Eyes:     Conjunctiva/sclera: Conjunctivae normal.  Cardiovascular:     Rate and Rhythm: Normal rate and regular rhythm.     Heart sounds: No murmur heard.  Pulmonary:     Effort: Pulmonary effort is normal. No respiratory distress.     Breath sounds: Normal breath sounds. No wheezing.  Abdominal:     Palpations: Abdomen is soft.     Tenderness: There is no abdominal tenderness.  Musculoskeletal:        General: No swelling.     Cervical back: Neck supple.  Skin:    General: Skin is warm and dry.     Capillary Refill: Capillary refill takes less than 2 seconds.  Neurological:     Mental Status: She is alert and oriented to person, place, and time.  Psychiatric:        Mood and Affect: Mood normal.     ED Results / Procedures / Treatments   Labs (all labs ordered are listed, but only abnormal results are  displayed) Labs Reviewed  RESP PANEL BY RT-PCR (RSV, FLU A&B, COVID)  RVPGX2 - Abnormal; Notable for the following components:      Result Value   SARS Coronavirus 2 by RT PCR POSITIVE (*)    All other components within normal limits  CBC WITH DIFFERENTIAL/PLATELET - Abnormal; Notable for the following components:   Lymphs Abs 0.4 (*)    All other components within normal limits  COMPREHENSIVE METABOLIC PANEL - Abnormal; Notable for the following components:   Sodium 130 (*)    Glucose, Bld 125 (*)    All other components within normal limits    EKG None  Radiology No results found.  Procedures Procedures    Medications Ordered in ED Medications  ondansetron (ZOFRAN) injection 4 mg (has no administration in time range)  sodium chloride 0.9 % bolus 1,000 mL (1,000 mLs Intravenous New Bag/Given 06/30/22 2220)  ondansetron (ZOFRAN) injection 4 mg (4 mg Intravenous Given 06/30/22 2219)  acetaminophen (TYLENOL) tablet 650 mg (650 mg Oral Given 06/30/22 2219)    ED Course/ Medical Decision Making/ A&P                          Medical Decision Making Amount and/or Complexity of Data Reviewed Labs: ordered.  Risk OTC drugs. Prescription drug management.   67 year old female presents ED for evaluation.  Please see HPI for further details.  On examination the patient is afebrile, nontachycardic.  Patient lung sounds clear bilaterally, she is not hypoxic.  Patient abdomen soft and compressible throughout.  Posterior oropharynx is erythematous without exudate.  Patient CBC unremarkable without leukocytosis or anemia.  Patient CMP shows decreased sodium to 130.  Patient viral panel positive for COVID.  Patient provided 650 mg of Tylenol, 1 L fluid, 4 mg Zofran for nausea.  Patient will be sent home with Paxlovid, Zofran.  Patient advised to continue pushing fluids.  Patient advised on how to treat symptoms at home conservatively.  Patient advised to quarantine.  Patient discharged  in stable condition.   Final Clinical Impression(s) / ED Diagnoses Final diagnoses:  COVID-19    Rx / DC Orders ED Discharge Orders          Ordered    nirmatrelvir/ritonavir (PAXLOVID) 20 x 150 MG & 10 x '100MG'$  TABS  2 times daily        06/30/22 2323    ondansetron (ZOFRAN) 4 MG tablet  Every 6 hours PRN        06/30/22 2323              Azucena Cecil, PA-C 06/30/22 2325  Sherwood Gambler, MD 07/04/22 [phone]

## 2022-07-01 ENCOUNTER — Telehealth (HOSPITAL_COMMUNITY): Payer: Self-pay | Admitting: Emergency Medicine

## 2022-07-01 MED ORDER — ONDANSETRON HCL 4 MG PO TABS: 4.0000 mg | ORAL_TABLET | Freq: Four times a day (QID) | ORAL | 0 refills | Status: DC | PRN

## 2022-07-01 NOTE — Telephone Encounter (Signed)
Patient husband called requesting patient prescription for Zofran to be switched to [facility] on [address].  This has been completed.

## 2022-07-04 ENCOUNTER — Telehealth: Payer: Self-pay | Admitting: Cardiology

## 2022-07-04 NOTE — Telephone Encounter (Signed)
FYI.  °Contacted patient regarding recall appointment, patient notified our office they did not wish to keep this appointment at this time.  Deleted recall from system. °

## 2022-07-06 DIAGNOSIS — U099 Post covid-19 condition, unspecified: Secondary | ICD-10-CM | POA: Diagnosis not present

## 2022-07-17 DIAGNOSIS — Z1283 Encounter for screening for malignant neoplasm of skin: Secondary | ICD-10-CM | POA: Diagnosis not present

## 2022-07-17 DIAGNOSIS — D225 Melanocytic nevi of trunk: Secondary | ICD-10-CM | POA: Diagnosis not present

## 2022-08-01 DIAGNOSIS — M9903 Segmental and somatic dysfunction of lumbar region: Secondary | ICD-10-CM | POA: Diagnosis not present

## 2022-08-01 DIAGNOSIS — M5451 Vertebrogenic low back pain: Secondary | ICD-10-CM | POA: Diagnosis not present

## 2022-08-01 DIAGNOSIS — M9901 Segmental and somatic dysfunction of cervical region: Secondary | ICD-10-CM | POA: Diagnosis not present

## 2022-08-01 DIAGNOSIS — M9902 Segmental and somatic dysfunction of thoracic region: Secondary | ICD-10-CM | POA: Diagnosis not present

## 2022-08-01 DIAGNOSIS — M542 Cervicalgia: Secondary | ICD-10-CM | POA: Diagnosis not present

## 2022-08-24 IMAGING — DX DG FOOT COMPLETE 3+V*R*
3 series · 3 of 3 positions shown · non-contrast
Comparison: None.

CLINICAL DATA: Foot pain since fall

EXAM:
RIGHT FOOT COMPLETE - 3+ VIEW

[foot ap]
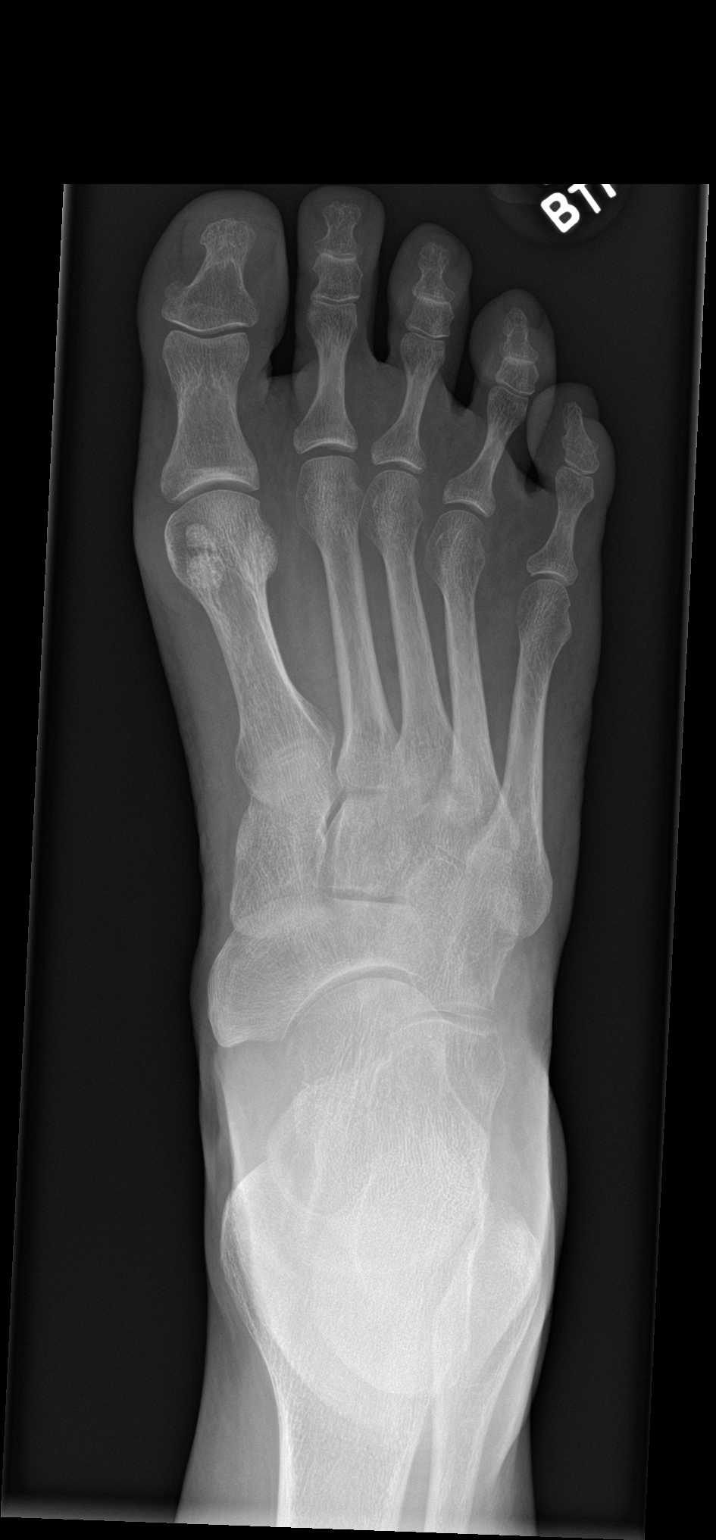

[foot obl]
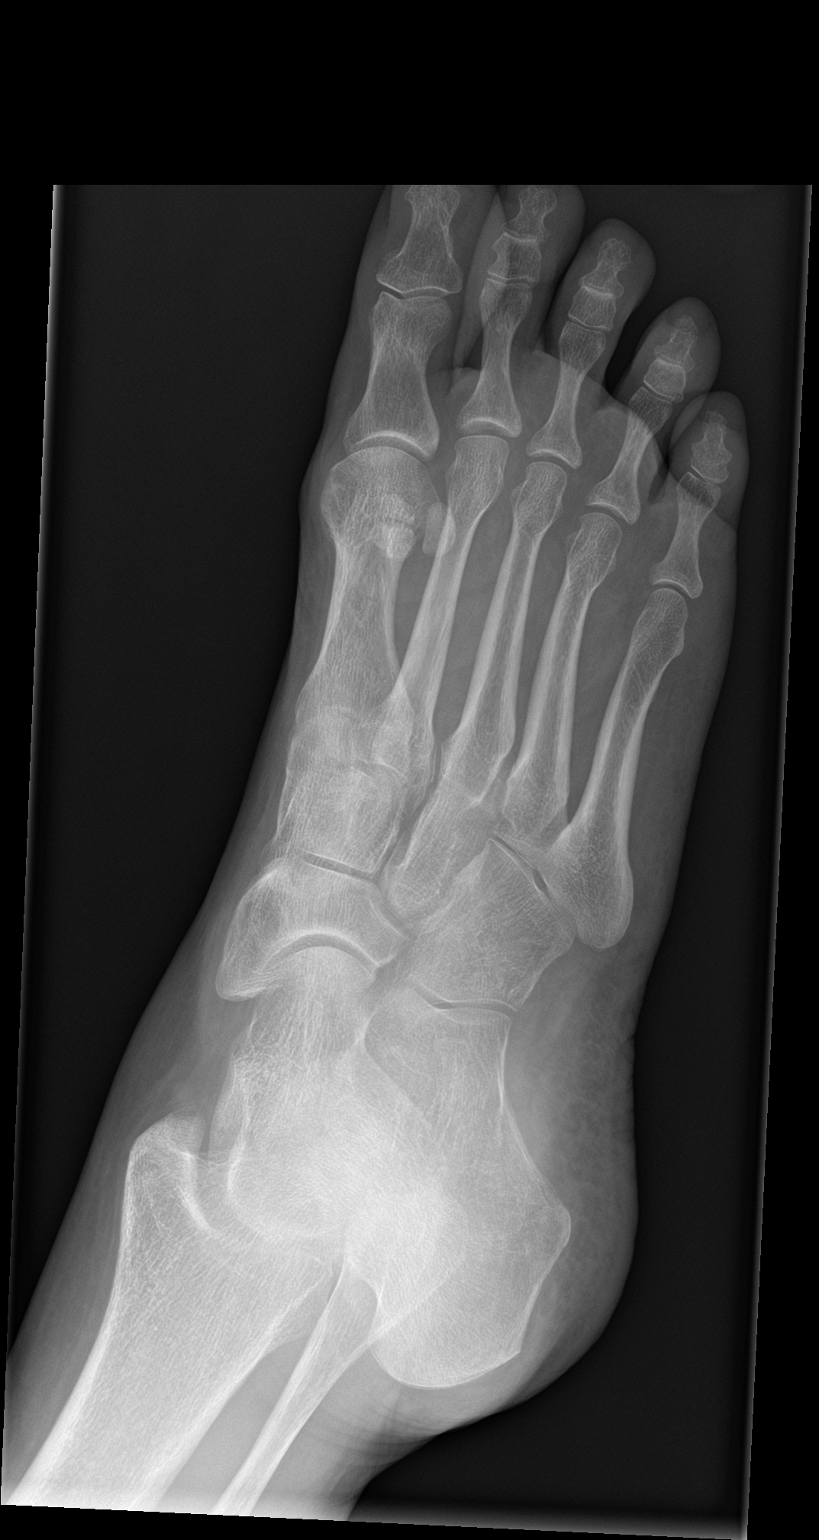

[foot lat]
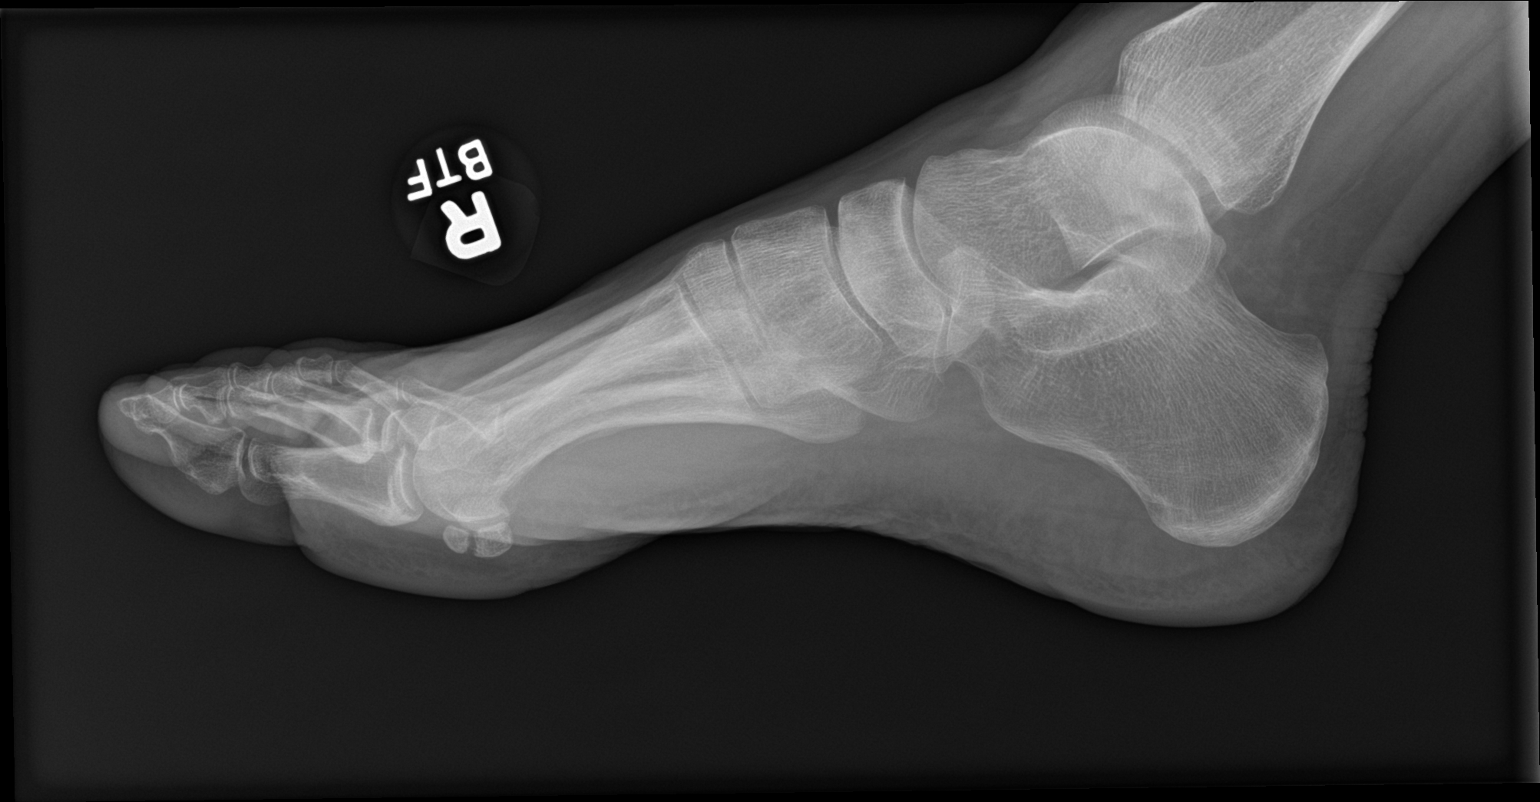

[3 of 3 positions shown; findings below may reference images not displayed]

FINDINGS: There is no evidence of fracture or dislocation. There is no
evidence of arthropathy or other focal bone abnormality. Soft
tissues are unremarkable.
IMPRESSION: Negative.

## 2022-08-24 IMAGING — DX DG ANKLE COMPLETE 3+V*R*
3 series · 3 of 3 positions shown · non-contrast
Comparison: None.

CLINICAL DATA: Ankle pain since fall

EXAM:
RIGHT ANKLE - COMPLETE 3+ VIEW

[ankle ap]
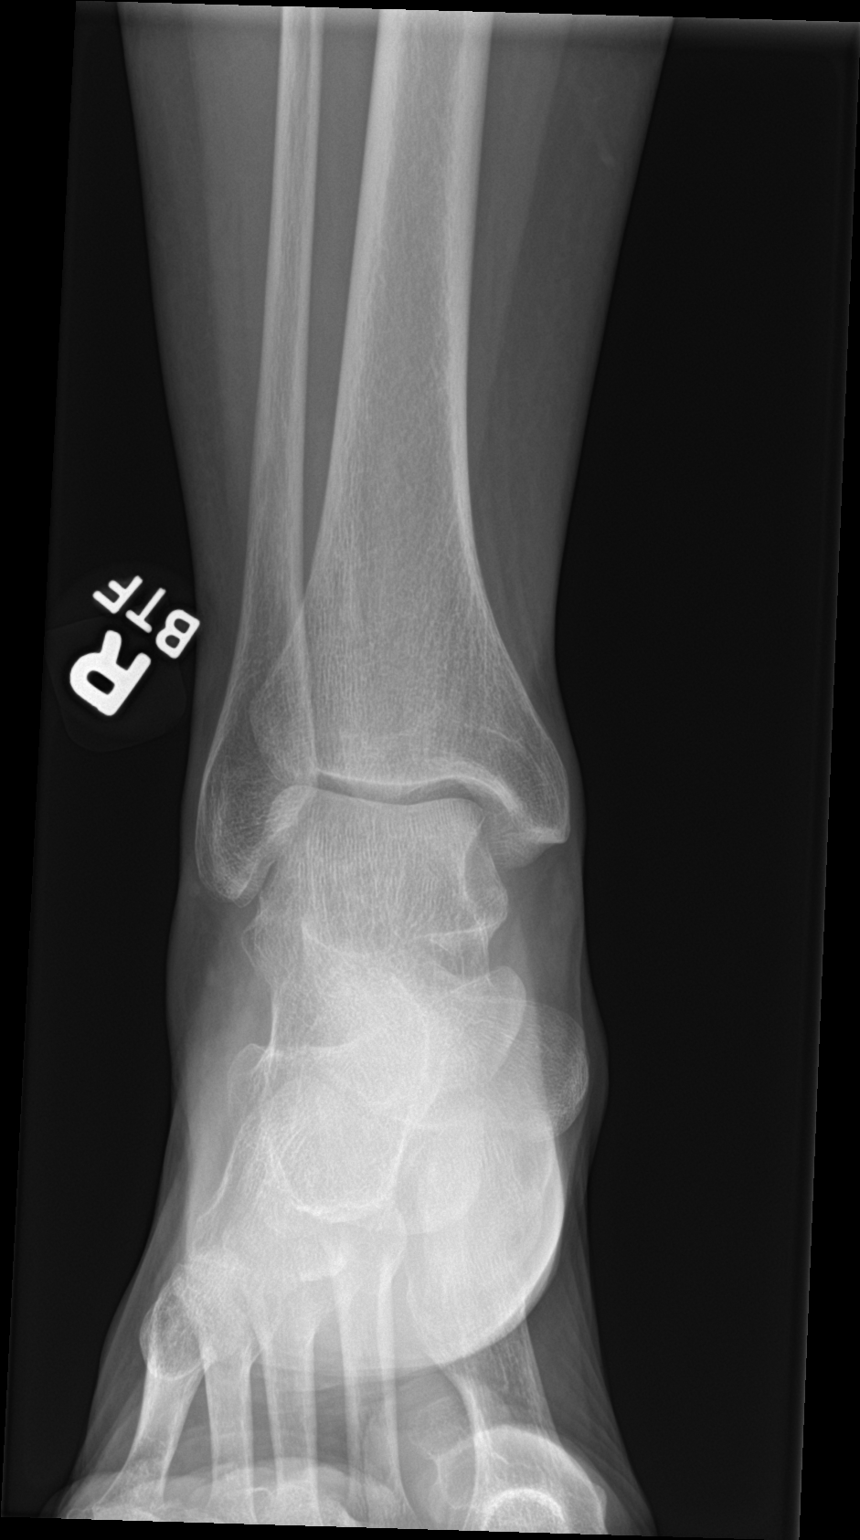

[ankle obl]
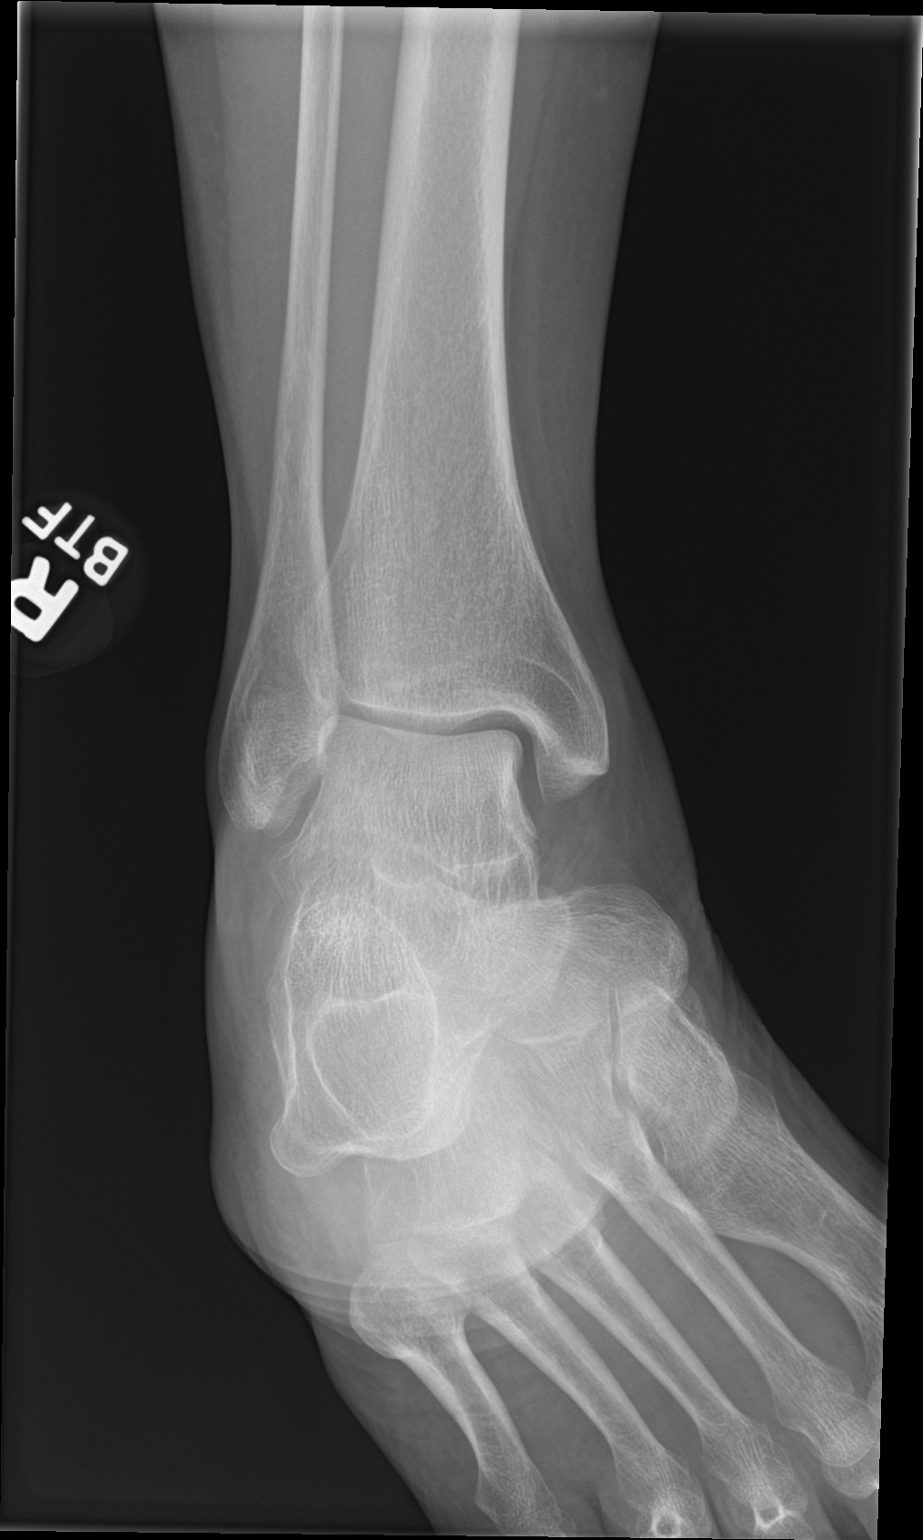

[ankle lat]
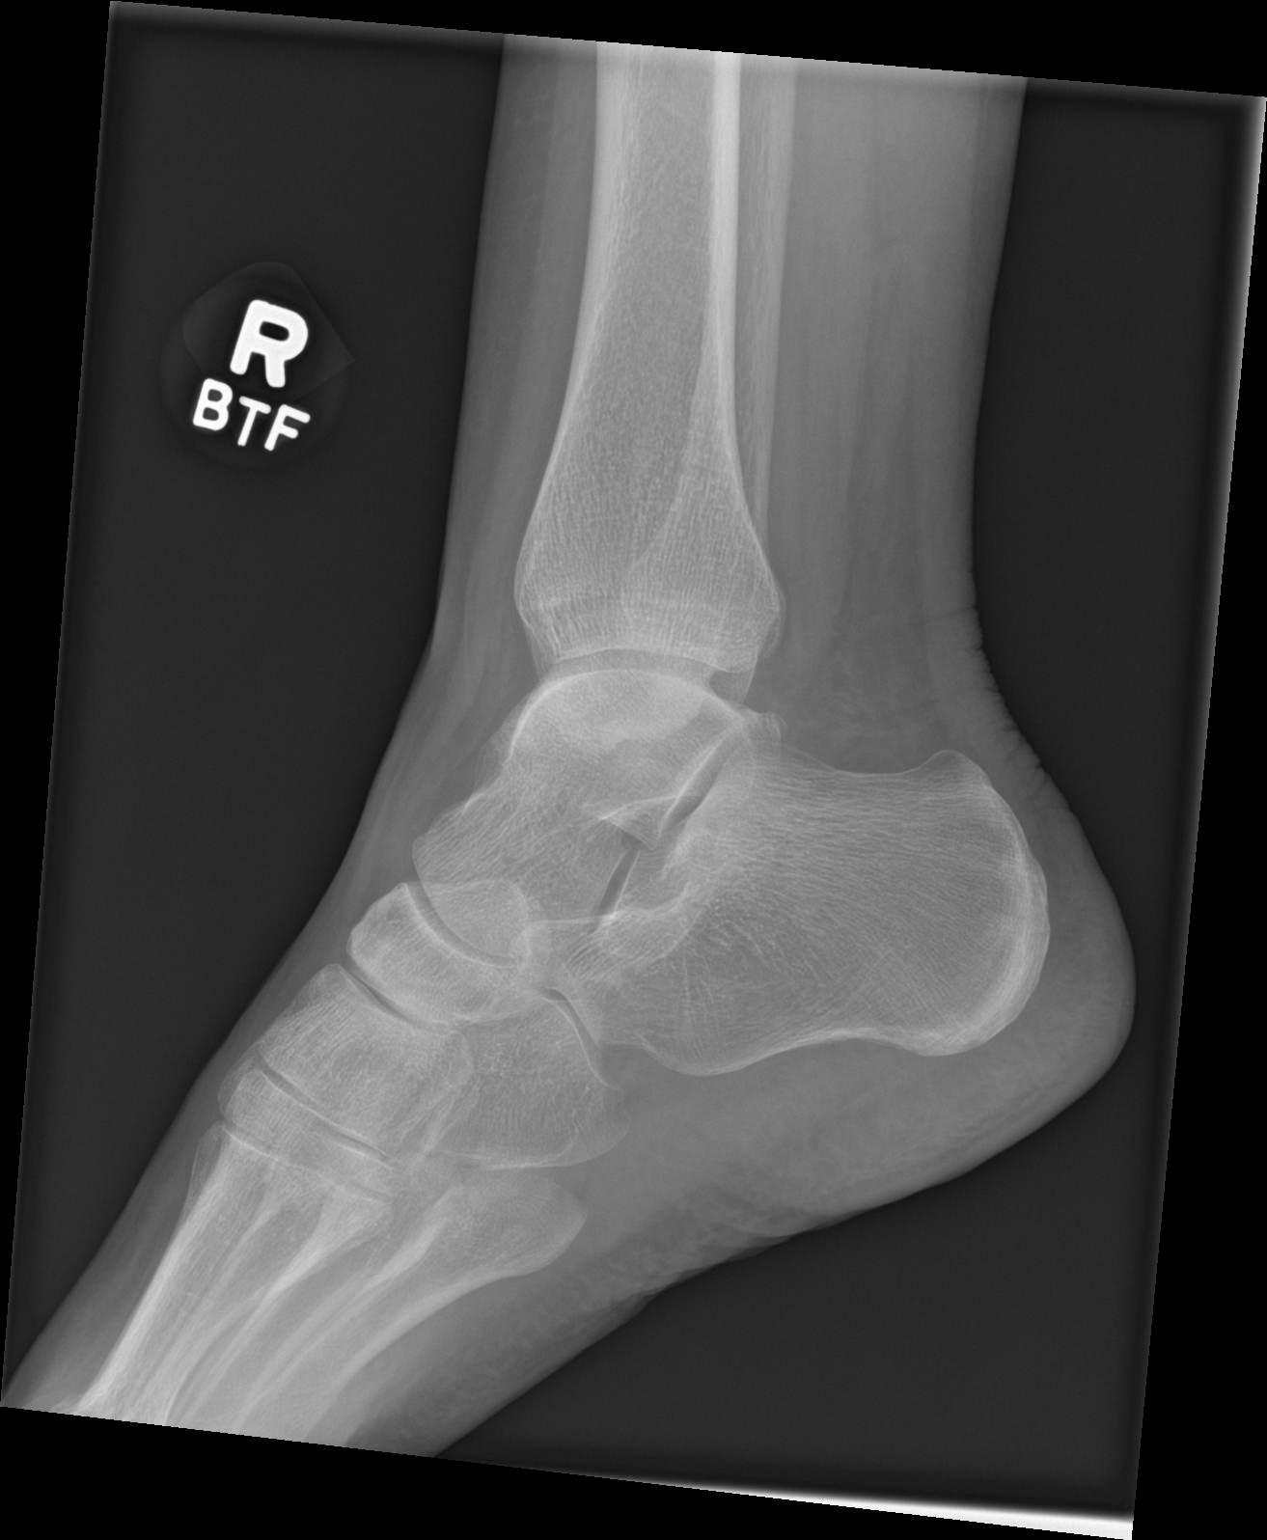

[3 of 3 positions shown; findings below may reference images not displayed]

FINDINGS: There is no evidence of fracture, dislocation, or joint effusion.
There is no evidence of arthropathy or other focal bone abnormality.
Soft tissues are unremarkable.
IMPRESSION: Negative.

## 2022-09-01 ENCOUNTER — Ambulatory Visit
Admission: RE | Admit: 2022-09-01 | Discharge: 2022-09-01 | Disposition: A | Discharge status: Home / Self Care | Payer: Medicare PPO | Source: Ambulatory Visit | Attending: Nurse Practitioner | Admitting: Nurse Practitioner

## 2022-09-01 VITALS — BP 107/63 | HR 75 | Temp 98.3°F | Resp 18

## 2022-09-01 DIAGNOSIS — J0140 Acute pansinusitis, unspecified: Secondary | ICD-10-CM | POA: Diagnosis not present

## 2022-09-01 DIAGNOSIS — J029 Acute pharyngitis, unspecified: Secondary | ICD-10-CM | POA: Diagnosis not present

## 2022-09-01 LAB — POCT RAPID STREP A (OFFICE): Rapid Strep A Screen: NEGATIVE

## 2022-09-01 MED ORDER — LIDOCAINE VISCOUS HCL 2 % MT SOLN: 5.0000 mL | Freq: Four times a day (QID) | OROMUCOSAL | 0 refills | Status: DC | PRN

## 2022-09-01 MED ORDER — AMOXICILLIN-POT CLAVULANATE 875-125 MG PO TABS: 1.0000 | ORAL_TABLET | Freq: Two times a day (BID) | ORAL | 0 refills | Status: DC

## 2022-09-01 NOTE — ED Triage Notes (Signed)
Sore throat, cough, nasal congestion, headache, body aches x 2 weeks. Has been taking advil, zinc, and using a netti pot.

## 2022-09-01 NOTE — ED Provider Notes (Signed)
[facility]    CSN: [id number] Arrival date & time: 09/01/22  [id number]      History   Chief Complaint Chief Complaint  Patient presents with   Sore Throat    Entered by patient    HPI Morgan Harrison is a 67 y.o. female.   The history is provided by the patient.   The patient presents for complaints of bodyaches, nasal congestion, headache, sore throat, and cough.  Symptoms have been present for the past 2 weeks.  Patient denies fever, but is unsure, ear pain, ear drainage, chest pain, wheezing, shortness of breath, difficulty breathing, or GI symptoms.  She describes her throat pain as "fiery", and states that her throat pain has been consistent.  She states she has been taking Advil, zinc, using a neti pot, for her symptoms.  She states that symptoms do not appear to be improving at this time.  She states that she does feel worse at night.  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 Authorizing Provider  amoxicillin-clavulanate (AUGMENTIN) 875-125 MG tablet Take 1 tablet by mouth every 12 (twelve) hours. 09/01/22  Yes Jalaina Salyers-Warren, Alda Lea, NP  lidocaine (XYLOCAINE) 2 % solution Use as directed 5 mLs in the mouth or throat every 6 (six) hours as needed for mouth  pain. Gargle and spit 5 mL every 6 hours as needed for throat pain or discomfort. 09/01/22  Yes Cathalina Barcia-Warren, Alda Lea, NP  aspirin 81 MG tablet Take 81 mg by mouth daily.    [provider]  B Complex Vitamins (VITAMIN B COMPLEX PO) Take by mouth.    [provider]  Cholecalciferol (VITAMIN D3 PO) Take 4,000 Int'l Units by mouth.    [provider]  escitalopram (LEXAPRO) 10 MG tablet TAKE (1) TABLET BY MOUTH ONCE DAILY. 12/14/21   Salvadore Dom, MD  Estradiol 10 MCG TABS vaginal tablet Place one tablet vaginally 2 x a week at hs 12/14/21   Salvadore Dom, MD  ondansetron (ZOFRAN) 4 MG tablet Take 1 tablet (4 mg total) by mouth every 6 (six) hours as needed for nausea or vomiting. 07/01/22   Azucena Cecil, PA-C  rosuvastatin (CRESTOR) 5 MG tablet Take by mouth. 11/30/20   [provider]    Family History Family History  Problem Relation Age of Onset  Neuropathy Father        idiopathic    Social History Social History   Tobacco Use   Smoking status: Never   Smokeless tobacco: Never  Substance Use Topics   Alcohol use: No   Drug use: No     Allergies   Patient has no known allergies.   Review of Systems Review of Systems Per HPI  Physical Exam Triage Vital Signs ED Triage Vitals  Enc Vitals Group     BP 09/01/22 1011 107/63     Pulse Rate 09/01/22 1011 75     Resp 09/01/22 1011 18     Temp 09/01/22 1011 98.3 F (36.8 C)     Temp Source 09/01/22 1011 Oral     SpO2 09/01/22 1011 98 %     Weight --      Height --      Head Circumference --      Peak Flow --      Pain Score 09/01/22 1012 7     Pain Loc --      Pain Edu? --      Excl. in [place]? --    No data found.  Updated Vital Signs BP 107/63 (BP Location: Right Arm)   Pulse 75   Temp 98.3 F (36.8 C) (Oral)   Resp 18   LMP 09/19/2011   SpO2 98%   Visual Acuity Right Eye Distance:   Left Eye Distance:   Bilateral Distance:    Right Eye Near:    Left Eye Near:    Bilateral Near:     Physical Exam Vitals and nursing note reviewed.  Constitutional:      General: She is not in acute distress.    Appearance: She is well-developed.  HENT:     Head: Normocephalic.     Right Ear: Tympanic membrane and ear canal normal.     Left Ear: Tympanic membrane and ear canal normal.     Nose: Congestion present. No rhinorrhea.     Right Turbinates: Enlarged and swollen.     Left Turbinates: Enlarged and swollen.     Right Sinus: Maxillary sinus tenderness and frontal sinus tenderness present.     Left Sinus: Maxillary sinus tenderness and frontal sinus tenderness present.     Mouth/Throat:     Lips: Pink.     Mouth: Mucous membranes are moist.     Pharynx: Uvula midline. Pharyngeal swelling and posterior oropharyngeal erythema present. No oropharyngeal exudate or uvula swelling.     Tonsils: No tonsillar exudate. 1+ on the right. 1+ on the left.     Comments: Cobblestoning present on posterior oropharynx Eyes:     Conjunctiva/sclera: Conjunctivae normal.     Pupils: Pupils are equal, round, and reactive to light.  Cardiovascular:     Rate and Rhythm: Normal rate and regular rhythm.     Heart sounds: Normal heart sounds.  Pulmonary:     Effort: Pulmonary effort is normal. No respiratory distress.     Breath sounds: Normal breath sounds. No stridor. No wheezing, rhonchi or rales.  Abdominal:     General: Bowel sounds are normal.     Palpations: Abdomen is soft.     Tenderness: There is no abdominal tenderness.  Musculoskeletal:     Cervical back: Normal range of motion.  Lymphadenopathy:     Cervical: No cervical adenopathy.  Skin:    General: Skin is warm and dry.  Neurological:     General: No focal  deficit present.     Mental Status: She is alert and oriented to person, place, and time.  Psychiatric:        Mood and Affect: Mood normal.        Behavior: Behavior normal.      UC Treatments / Results  Labs (all labs  ordered are listed, but only abnormal results are displayed) Labs Reviewed  CULTURE, GROUP A STREP [facility])  POCT RAPID STREP A (OFFICE)    EKG   Radiology No results found.  Procedures Procedures (including critical care time)  Medications Ordered in UC Medications - No data to display  Initial Impression / Assessment and Plan / UC Course  I have reviewed the triage vital signs and the nursing notes.  Pertinent labs & imaging results that were available during my care of the patient were reviewed by me and considered in my medical decision making (see chart for details).  The patient is well-appearing, she is in no acute distress, vital signs are stable.  Strep test was negative, throat culture is pending.  Suspect acute sinusitis as patient has postnasal drainage, maxillary and frontal sinus tenderness.  Given the duration of her symptoms, will treat with Augmentin 875 125 mg, and viscous lidocaine 2% for the patient to gargle and spit for her throat pain or discomfort.  Patient was given supportive care recommendations to include Tylenol as needed for pain, fever, or general discomfort, warm salt water gargles, and a soft diet symptoms persist.  Patient is in agreement with this plan of care and verbalizes understanding.  All questions were answered.  Patient stable for discharge.   Final Clinical Impressions(s) / UC Diagnoses   Final diagnoses:  Acute pansinusitis, recurrence not specified  Sore throat     Discharge Instructions      The rapid strep test is negative.  A throat culture is pending. Take medication as prescribed. Recommend Tylenol for pain, fever, general discomfort. Warm salt water gargles 3-4 times daily to help with throat pain or discomfort. Recommend a soft diet until throat symptoms improved. For your cough, recommend using a humidifier in your bedroom at nighttime during sleep and sleeping elevated on pillows while cough symptoms persist. If  symptoms do not improve with this treatment, recommend follow-up with your primary care physician for further evaluation. Follow-up as needed.     ED Prescriptions     Medication Sig Dispense Auth. Provider   amoxicillin-clavulanate (AUGMENTIN) 875-125 MG tablet Take 1 tablet by mouth every 12 (twelve) hours. 14 tablet Julieann Drummonds-Warren, Alda Lea, NP   lidocaine (XYLOCAINE) 2 % solution Use as directed 5 mLs in the mouth or throat every 6 (six) hours as needed for mouth pain. Gargle and spit 5 mL every 6 hours as needed for throat pain or discomfort. 100 mL Ogden Handlin-Warren, Alda Lea, NP      PDMP not reviewed this encounter.   Tish Men, NP 09/01/22 1052

## 2022-09-01 NOTE — Discharge Instructions (Addendum)
The rapid strep test is negative.  A throat culture is pending. Take medication as prescribed. Recommend Tylenol for pain, fever, general discomfort. Warm salt water gargles 3-4 times daily to help with throat pain or discomfort. Recommend a soft diet until throat symptoms improved. For your cough, recommend using a humidifier in your bedroom at nighttime during sleep and sleeping elevated on pillows while cough symptoms persist. If symptoms do not improve with this treatment, recommend follow-up with your primary care physician for further evaluation. Follow-up as needed.

## 2022-09-04 LAB — CULTURE, GROUP A STREP (THRC)

## 2022-09-26 DIAGNOSIS — M9903 Segmental and somatic dysfunction of lumbar region: Secondary | ICD-10-CM | POA: Diagnosis not present

## 2022-09-26 DIAGNOSIS — M542 Cervicalgia: Secondary | ICD-10-CM | POA: Diagnosis not present

## 2022-09-26 DIAGNOSIS — M9901 Segmental and somatic dysfunction of cervical region: Secondary | ICD-10-CM | POA: Diagnosis not present

## 2022-09-26 DIAGNOSIS — M9902 Segmental and somatic dysfunction of thoracic region: Secondary | ICD-10-CM | POA: Diagnosis not present

## 2022-10-01 ENCOUNTER — Other Ambulatory Visit: Payer: Self-pay | Admitting: Obstetrics and Gynecology

## 2022-10-01 DIAGNOSIS — N941 Unspecified dyspareunia: Secondary | ICD-10-CM

## 2022-10-01 DIAGNOSIS — N952 Postmenopausal atrophic vaginitis: Secondary | ICD-10-CM

## 2022-11-21 DIAGNOSIS — M9903 Segmental and somatic dysfunction of lumbar region: Secondary | ICD-10-CM | POA: Diagnosis not present

## 2022-11-21 DIAGNOSIS — M542 Cervicalgia: Secondary | ICD-10-CM | POA: Diagnosis not present

## 2022-11-21 DIAGNOSIS — M9901 Segmental and somatic dysfunction of cervical region: Secondary | ICD-10-CM | POA: Diagnosis not present

## 2022-11-21 DIAGNOSIS — M9902 Segmental and somatic dysfunction of thoracic region: Secondary | ICD-10-CM | POA: Diagnosis not present

## 2022-12-19 NOTE — Progress Notes (Deleted)
67 y.o. G3P3 Married Caucasian female here for annual exam.    PCP:     Patient's last menstrual period was 09/19/2011.           Sexually active: {yes no:314532}  The current method of family planning is post menopausal status.    Exercising: {yes no:314532}  {types:19826} Smoker:  no  Health Maintenance: Pap:  11/24/18 neg: HR HPV neg, 10/17/16 neg History of abnormal Pap:  yes MMG:  03/07/22 Breast density Cat C, BI-RADS CAT 1 neg Colonoscopy:  08/26/09 BMD:   12/16/17  Result  osteopenic TDaP:  10/07/14 Gardasil:   no HIV: Hep C: Screening Labs:  Hb today: ***, Urine today: ***   reports that she has never smoked. She has never used smokeless tobacco. She reports that she does not drink alcohol and does not use drugs.  Past Medical History:  Diagnosis Date   Abnormal Pap smear of cervix    just repeat done   Anxiety and depression    Fibroid    Shingles     Past Surgical History:  Procedure Laterality Date   CESAREAN SECTION  1993   ENDOMETRIAL BIOPSY     x2   TONSILLECTOMY  1963   TUBAL LIGATION  1993   BTL    Current Outpatient Medications  Medication Sig Dispense Refill   amoxicillin-clavulanate (AUGMENTIN) 875-125 MG tablet Take 1 tablet by mouth every 12 (twelve) hours. 14 tablet 0   aspirin 81 MG tablet Take 81 mg by mouth daily.     B Complex Vitamins (VITAMIN B COMPLEX PO) Take by mouth.     Cholecalciferol (VITAMIN D3 PO) Take 4,000 Int'l Units by mouth.     escitalopram (LEXAPRO) 10 MG tablet TAKE (1) TABLET BY MOUTH ONCE DAILY. 90 tablet 4   Estradiol 10 MCG TABS vaginal tablet Place one tablet vaginally 2 x a week at hs 24 tablet 4   lidocaine (XYLOCAINE) 2 % solution Use as directed 5 mLs in the mouth or throat every 6 (six) hours as needed for mouth pain. Gargle and spit 5 mL every 6 hours as needed for throat pain or discomfort. 100 mL 0   ondansetron (ZOFRAN) 4 MG tablet Take 1 tablet (4 mg total) by mouth every 6 (six) hours as needed for nausea or  vomiting. 15 tablet 0   rosuvastatin (CRESTOR) 5 MG tablet Take by mouth.     No current facility-administered medications for this visit.    Family History  Problem Relation Age of Onset   Neuropathy Father        idiopathic    Review of Systems  Exam:   LMP 09/19/2011     General appearance: alert, cooperative and appears stated age Head: normocephalic, without obvious abnormality, atraumatic Neck: no adenopathy, supple, symmetrical, trachea midline and thyroid normal to inspection and palpation Lungs: clear to auscultation bilaterally Breasts: normal appearance, no masses or tenderness, No nipple retraction or dimpling, No nipple discharge or bleeding, No axillary adenopathy Heart: regular rate and rhythm Abdomen: soft, non-tender; no masses, no organomegaly Extremities: extremities normal, atraumatic, no cyanosis or edema Skin: skin color, texture, turgor normal. No rashes or lesions Lymph nodes: cervical, supraclavicular, and axillary nodes normal. Neurologic: grossly normal  Pelvic: External genitalia:  no lesions              No abnormal inguinal nodes palpated.              Urethra:  normal appearing urethra with  no masses, tenderness or lesions              Bartholins and Skenes: normal                 Vagina: normal appearing vagina with normal color and discharge, no lesions              Cervix: no lesions              Pap taken: {yes no:314532} Bimanual Exam:  Uterus:  normal size, contour, position, consistency, mobility, non-tender              Adnexa: no mass, fullness, tenderness              Rectal exam: {yes no:314532}.  Confirms.              Anus:  normal sphincter tone, no lesions  Chaperone was present for exam:  ***  Assessment:   Well woman visit with gynecologic exam.   Plan: Mammogram screening discussed. Self breast awareness reviewed. Pap and HR HPV as above. Guidelines for Calcium, Vitamin D, regular exercise program including  cardiovascular and weight bearing exercise.   Follow up annually and prn.   Additional counseling given.  {yes [id number]. _______ minutes face to face time of which over 50% was spent in counseling.    After visit summary provided.

## 2022-12-20 ENCOUNTER — Ambulatory Visit: Payer: Medicare Other | Admitting: Obstetrics and Gynecology

## 2022-12-25 ENCOUNTER — Ambulatory Visit: Payer: Medicare Other | Admitting: Obstetrics and Gynecology

## 2022-12-25 NOTE — Progress Notes (Signed)
67 y.o. G3P3 Married Caucasian female here for annual exam.    She is followed for vaginal atrophy and uses Vagifem.  Having a little pain with intercourse.  Uses silicone based lubricant.  She is also treated for depression with Lexapro 10 mg daily.  Taking for 10 years.  Has tried to wean off, and has life circumstances each time, which makes her need to continue.   Mother is 30 yo.  Has 5 1/2 grandchildren and 2 foster grandchildren.   PCP:   Nita Sells, MD  Patient's last menstrual period was 09/19/2011.           Sexually active: Yes.    The current method of family planning is post menopausal status.    Exercising: Yes.     Weights twice a week, walk twice a week Smoker:  no  Health Maintenance: Pap:  11/24/18 neg: HR HPV neg, 10/17/16 neg History of abnormal Pap:  yes MMG:  03/07/22 Breast Density Cat C, BI-RADS CAT 1 neg Colonoscopy:  2021 per pt BMD:   12/16/17  Result  osteopenic TDaP:  10/07/14 Gardasil:   no HIV: n/a Hep C: n/a Screening Labs:  PCP   reports that she has never smoked. She has never used smokeless tobacco. She reports that she does not drink alcohol and does not use drugs.  Past Medical History:  Diagnosis Date   Abnormal Pap smear of cervix    just repeat done   Anxiety and depression    Fibroid    Shingles     Past Surgical History:  Procedure Laterality Date   CESAREAN SECTION  1993   ENDOMETRIAL BIOPSY     x2   TONSILLECTOMY  1963   TUBAL LIGATION  1993   BTL    Current Outpatient Medications  Medication Sig Dispense Refill   aspirin 81 MG tablet Take 81 mg by mouth daily.     B Complex Vitamins (VITAMIN B COMPLEX PO) Take by mouth.     Cholecalciferol (VITAMIN D3 PO) Take 4,000 Int'l Units by mouth.     escitalopram (LEXAPRO) 10 MG tablet TAKE (1) TABLET BY MOUTH ONCE DAILY. 90 tablet 4   Estradiol 10 MCG TABS vaginal tablet Place one tablet vaginally 2 x a week at hs 8 tablet 0   rosuvastatin (CRESTOR) 5 MG tablet Take by  mouth.     No current facility-administered medications for this visit.    Family History  Problem Relation Age of Onset   Neuropathy Father        idiopathic    Review of Systems  All other systems reviewed and are negative.   Exam:   BP 120/84 (BP Location: Right Arm, Patient Position: Sitting, Cuff Size: Normal)   Pulse 77   Ht 5' 2.5" (1.588 m)   Wt 114 lb (51.7 kg)   LMP 09/19/2011   SpO2 97%   BMI 20.52 kg/m     General appearance: alert, cooperative and appears stated age Head: normocephalic, without obvious abnormality, atraumatic Neck: no adenopathy, supple, symmetrical, trachea midline and thyroid normal to inspection and palpation Lungs: clear to auscultation bilaterally Breasts: normal appearance, no masses or tenderness, No nipple retraction or dimpling, No nipple discharge or bleeding, No axillary adenopathy Heart: regular rate and rhythm Abdomen: soft, non-tender; no masses, no organomegaly Extremities: extremities normal, atraumatic, no cyanosis or edema Skin: skin color, texture, turgor normal. No rashes or lesions Lymph nodes: cervical, supraclavicular, and axillary nodes normal. Neurologic: grossly normal  Pelvic: External genitalia:  no lesions              No abnormal inguinal nodes palpated.              Urethra:  normal appearing urethra with no masses, tenderness or lesions              Bartholins and Skenes: normal                 Vagina: normal appearing vagina with normal color and discharge, no lesions              Cervix: no lesions              Pap taken: yes Bimanual Exam:  Uterus:  normal size, contour, position, consistency, mobility, non-tender              Adnexa: no mass, fullness, tenderness              Rectal exam: yes.  Confirms.              Anus:  normal sphincter tone, no lesions  Chaperone was present for exam:  Warren Lacy, CMA  Assessment:   Well woman visit with gynecologic exam. Vaginal atrophy.  Depression.   Osteopenia.  Menopause.  Plan: Mammogram screening discussed. Self breast awareness reviewed. Pap and HR HPV as above. Guidelines for Calcium, Vitamin D, regular exercise program including cardiovascular and weight bearing exercise. Refill of Vagifem.  I discussed potential effect on breast cancer.  Refill of Lexapro.   BMD ordered.   Follow up annually and prn.   25  total time was spent for this patient encounter, including preparation, face-to-face counseling with the patient, coordination of care, and documentation of the encounter in addition to doing breast, pelvic and pap.

## 2023-01-01 ENCOUNTER — Other Ambulatory Visit: Payer: Self-pay

## 2023-01-01 DIAGNOSIS — N952 Postmenopausal atrophic vaginitis: Secondary | ICD-10-CM

## 2023-01-01 DIAGNOSIS — N941 Unspecified dyspareunia: Secondary | ICD-10-CM

## 2023-01-01 MED ORDER — ESTRADIOL 10 MCG VA TABS: ORAL_TABLET | VAGINAL | 0 refills | Status: DC

## 2023-01-01 NOTE — Telephone Encounter (Signed)
JJ pt LVM in triage line requesting refill on meds.  Med refill request: estradiol vag tabs Last AEX: 12/14/2021 Next AEX: 01/08/2023 Last MMG (if hormonal med) 03/07/2022-neg birads 1 Refill authorized: Rx pend.

## 2023-01-03 ENCOUNTER — Ambulatory Visit: Payer: Medicare Other | Admitting: Obstetrics and Gynecology

## 2023-01-08 ENCOUNTER — Ambulatory Visit (INDEPENDENT_AMBULATORY_CARE_PROVIDER_SITE_OTHER): Payer: Medicare PPO | Admitting: Obstetrics and Gynecology

## 2023-01-08 ENCOUNTER — Encounter: Payer: Self-pay | Admitting: Obstetrics and Gynecology

## 2023-01-08 ENCOUNTER — Other Ambulatory Visit (HOSPITAL_COMMUNITY)
Admission: RE | Admit: 2023-01-08 | Discharge: 2023-01-08 | Disposition: A | Discharge status: Home / Self Care | Payer: Medicare PPO | Source: Ambulatory Visit | Attending: Obstetrics and Gynecology | Admitting: Obstetrics and Gynecology

## 2023-01-08 VITALS — BP 120/84 | HR 77 | Ht 62.5 in | Wt 114.0 lb

## 2023-01-08 DIAGNOSIS — Z124 Encounter for screening for malignant neoplasm of cervix: Secondary | ICD-10-CM | POA: Insufficient documentation

## 2023-01-08 DIAGNOSIS — Z9189 Other specified personal risk factors, not elsewhere classified: Secondary | ICD-10-CM

## 2023-01-08 DIAGNOSIS — Z8659 Personal history of other mental and behavioral disorders: Secondary | ICD-10-CM

## 2023-01-08 DIAGNOSIS — Z1151 Encounter for screening for human papillomavirus (HPV): Secondary | ICD-10-CM | POA: Diagnosis not present

## 2023-01-08 DIAGNOSIS — Z01419 Encounter for gynecological examination (general) (routine) without abnormal findings: Secondary | ICD-10-CM

## 2023-01-08 DIAGNOSIS — N941 Unspecified dyspareunia: Secondary | ICD-10-CM

## 2023-01-08 DIAGNOSIS — Z8619 Personal history of other infectious and parasitic diseases: Secondary | ICD-10-CM

## 2023-01-08 DIAGNOSIS — N952 Postmenopausal atrophic vaginitis: Secondary | ICD-10-CM | POA: Diagnosis not present

## 2023-01-08 DIAGNOSIS — Z780 Asymptomatic menopausal state: Secondary | ICD-10-CM

## 2023-01-08 MED ORDER — ESTRADIOL 10 MCG VA TABS: ORAL_TABLET | VAGINAL | 3 refills | Status: DC

## 2023-01-08 MED ORDER — ESCITALOPRAM OXALATE 10 MG PO TABS: ORAL_TABLET | ORAL | 3 refills | Status: DC

## 2023-01-08 NOTE — Patient Instructions (Signed)

## 2023-01-10 ENCOUNTER — Other Ambulatory Visit (HOSPITAL_COMMUNITY): Payer: Self-pay | Admitting: Obstetrics and Gynecology

## 2023-01-10 DIAGNOSIS — Z1231 Encounter for screening mammogram for malignant neoplasm of breast: Secondary | ICD-10-CM

## 2023-01-14 NOTE — Progress Notes (Signed)
Pap and high risk HPV testing were done for cervical cancer screening.   Thank you.   Conley Simmonds, MD

## 2023-01-16 DIAGNOSIS — M9901 Segmental and somatic dysfunction of cervical region: Secondary | ICD-10-CM | POA: Diagnosis not present

## 2023-01-16 DIAGNOSIS — M542 Cervicalgia: Secondary | ICD-10-CM | POA: Diagnosis not present

## 2023-01-16 DIAGNOSIS — M9903 Segmental and somatic dysfunction of lumbar region: Secondary | ICD-10-CM | POA: Diagnosis not present

## 2023-01-16 DIAGNOSIS — M9902 Segmental and somatic dysfunction of thoracic region: Secondary | ICD-10-CM | POA: Diagnosis not present

## 2023-01-18 ENCOUNTER — Other Ambulatory Visit (HOSPITAL_COMMUNITY): Payer: Medicare PPO

## 2023-02-21 DIAGNOSIS — E782 Mixed hyperlipidemia: Secondary | ICD-10-CM | POA: Diagnosis not present

## 2023-02-21 DIAGNOSIS — E559 Vitamin D deficiency, unspecified: Secondary | ICD-10-CM | POA: Diagnosis not present

## 2023-02-21 DIAGNOSIS — R7303 Prediabetes: Secondary | ICD-10-CM | POA: Diagnosis not present

## 2023-02-28 DIAGNOSIS — Z0000 Encounter for general adult medical examination without abnormal findings: Secondary | ICD-10-CM | POA: Diagnosis not present

## 2023-02-28 DIAGNOSIS — R7303 Prediabetes: Secondary | ICD-10-CM | POA: Diagnosis not present

## 2023-02-28 DIAGNOSIS — F411 Generalized anxiety disorder: Secondary | ICD-10-CM | POA: Diagnosis not present

## 2023-02-28 DIAGNOSIS — E782 Mixed hyperlipidemia: Secondary | ICD-10-CM | POA: Diagnosis not present

## 2023-03-11 ENCOUNTER — Encounter: Payer: Self-pay | Admitting: Obstetrics and Gynecology

## 2023-03-11 ENCOUNTER — Ambulatory Visit (HOSPITAL_COMMUNITY)
Admission: RE | Admit: 2023-03-11 | Discharge: 2023-03-11 | Disposition: A | Discharge status: Home / Self Care | Payer: Medicare PPO | Source: Ambulatory Visit | Attending: Obstetrics and Gynecology | Admitting: Obstetrics and Gynecology

## 2023-03-11 DIAGNOSIS — Z780 Asymptomatic menopausal state: Secondary | ICD-10-CM

## 2023-03-11 DIAGNOSIS — Z1231 Encounter for screening mammogram for malignant neoplasm of breast: Secondary | ICD-10-CM | POA: Insufficient documentation

## 2023-03-11 DIAGNOSIS — M8589 Other specified disorders of bone density and structure, multiple sites: Secondary | ICD-10-CM | POA: Diagnosis not present

## 2023-03-13 DIAGNOSIS — M9903 Segmental and somatic dysfunction of lumbar region: Secondary | ICD-10-CM | POA: Diagnosis not present

## 2023-03-13 DIAGNOSIS — M9901 Segmental and somatic dysfunction of cervical region: Secondary | ICD-10-CM | POA: Diagnosis not present

## 2023-03-13 DIAGNOSIS — M9902 Segmental and somatic dysfunction of thoracic region: Secondary | ICD-10-CM | POA: Diagnosis not present

## 2023-03-13 DIAGNOSIS — M542 Cervicalgia: Secondary | ICD-10-CM | POA: Diagnosis not present

## 2023-04-03 DIAGNOSIS — H2513 Age-related nuclear cataract, bilateral: Secondary | ICD-10-CM | POA: Diagnosis not present

## 2023-04-03 DIAGNOSIS — Z9889 Other specified postprocedural states: Secondary | ICD-10-CM | POA: Diagnosis not present

## 2023-04-16 ENCOUNTER — Telehealth: Payer: Self-pay | Admitting: Internal Medicine

## 2023-04-16 NOTE — Telephone Encounter (Signed)
Needs new patient approval

## 2023-04-17 NOTE — Telephone Encounter (Signed)
scheduled

## 2023-05-15 DIAGNOSIS — M9902 Segmental and somatic dysfunction of thoracic region: Secondary | ICD-10-CM | POA: Diagnosis not present

## 2023-05-15 DIAGNOSIS — M542 Cervicalgia: Secondary | ICD-10-CM | POA: Diagnosis not present

## 2023-05-15 DIAGNOSIS — M9903 Segmental and somatic dysfunction of lumbar region: Secondary | ICD-10-CM | POA: Diagnosis not present

## 2023-05-15 DIAGNOSIS — M9901 Segmental and somatic dysfunction of cervical region: Secondary | ICD-10-CM | POA: Diagnosis not present

## 2023-05-30 ENCOUNTER — Other Ambulatory Visit: Payer: Self-pay

## 2023-05-30 ENCOUNTER — Ambulatory Visit
Admission: RE | Admit: 2023-05-30 | Discharge: 2023-05-30 | Disposition: A | Discharge status: Home / Self Care | Payer: Medicare PPO | Source: Ambulatory Visit | Attending: Nurse Practitioner | Admitting: Nurse Practitioner

## 2023-05-30 VITALS — BP 90/60 | HR 90 | Temp 98.8°F | Resp 20

## 2023-05-30 DIAGNOSIS — J0140 Acute pansinusitis, unspecified: Secondary | ICD-10-CM

## 2023-05-30 MED ORDER — AMOXICILLIN-POT CLAVULANATE 875-125 MG PO TABS
1.0000 | ORAL_TABLET | Freq: Two times a day (BID) | ORAL | 0 refills | Status: AC
Start: 2023-05-30 — End: ?

## 2023-05-30 MED ORDER — PROMETHAZINE-DM 6.25-15 MG/5ML PO SYRP: 5.0000 mL | ORAL_SOLUTION | Freq: Four times a day (QID) | ORAL | 0 refills | Status: DC | PRN

## 2023-05-30 NOTE — Discharge Instructions (Addendum)
Take medication as directed. Increase fluids and get plenty of rest. May take over-the-counter Ibuprofen or Tylenol as needed for pain, fever, or general discomfort. Recommend normal saline nasal spray to help with nasal congestion throughout the day. For your cough, it may be helpful to use a humidifier at bedtime during sleep. If symptoms fail to improve with this treatment, you may follow-up in this clinic or with your primary care physician for further evaluation. Follow-up as needed.

## 2023-05-30 NOTE — ED Triage Notes (Signed)
Pt reports bilateral ear pressure, nasal congestion, intermittent low grade fevers, cough x1 week.

## 2023-05-30 NOTE — ED Provider Notes (Signed)
[facility]    CSN: [id number] Arrival date & time: 05/30/23  1014      History   Chief Complaint Chief Complaint  Patient presents with   Cough    Congestion, sore throat, runny nose, headache. I tried to schedule a 10am appointment and got no email confirmation so I'm scheduling 10:30. I'm going to show up at 10 just in case. Cancel 10:30 if you have me at 10. - Entered by patient    HPI Morgan Harrison is a 67 y.o. female.   The history is provided by the patient.   Patient presents for complaints of bilateral ear pressure, nasal congestion, intermittent low-grade fevers, and cough that is been present for more than 1 week.  Patient denies chills, ear drainage, headache, wheezing, difficulty breathing, chest pain, abdominal pain, nausea, vomiting, diarrhea, or rash.  Patient reports she has tried several over-the-counter cough and cold medications with minimal relief.  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 Authorizing Provider  amoxicillin-clavulanate (AUGMENTIN) 875-125 MG tablet Take 1 tablet by mouth every 12 (twelve) hours. 05/30/23  Yes Leath-Warren, Sadie Haber, NP  promethazine-dextromethorphan (PROMETHAZINE-DM) 6.25-15 MG/5ML syrup Take 5 mLs by  mouth 4 (four) times daily as needed. 05/30/23  Yes Leath-Warren, Sadie Haber, NP  aspirin 81 MG tablet Take 81 mg by mouth daily.    [provider]  B Complex Vitamins (VITAMIN B COMPLEX PO) Take by mouth.    [provider]  Cholecalciferol (VITAMIN D3 PO) Take 4,000 Int'l Units by mouth.    [provider]  escitalopram (LEXAPRO) 10 MG tablet TAKE (1) TABLET BY MOUTH ONCE DAILY. 01/08/23   Patton Salles, MD  Estradiol 10 MCG TABS vaginal tablet Place one tablet vaginally 2 x a week at hs 01/08/23   Patton Salles, MD  rosuvastatin (CRESTOR) 5 MG tablet Take by mouth. 11/30/20   [provider]    Family History Family History  Problem Relation Age of Onset   Neuropathy Father        idiopathic    Social History Social History   Tobacco Use   Smoking status: Never   Smokeless tobacco: Never  Substance Use Topics   Alcohol use: No   Drug use: No     Allergies  Patient has no known allergies.   Review of Systems Review of Systems Per HPI  Physical Exam Triage Vital Signs ED Triage Vitals  Encounter Vitals Group     BP 05/30/23 1058 90/60     Systolic BP Percentile --      Diastolic BP Percentile --      Pulse Rate 05/30/23 1058 90     Resp 05/30/23 1058 20     Temp 05/30/23 1058 98.8 F (37.1 C)     Temp Source 05/30/23 1058 Oral     SpO2 05/30/23 1058 95 %     Weight --      Height --      Head Circumference --      Peak Flow --      Pain Score 05/30/23 1100 4     Pain Loc --      Pain Education --      Exclude from Growth Chart --    No data found.  Updated Vital Signs BP 90/60 (BP Location: Right Arm)   Pulse 90   Temp 98.8 F (37.1 C) (Oral)   Resp 20   LMP 09/19/2011   SpO2 95%   Visual Acuity Right Eye Distance:   Left Eye Distance:   Bilateral Distance:    Right Eye Near:   Left Eye Near:    Bilateral Near:     Physical Exam Vitals and nursing note reviewed.  Constitutional:       General: She is not in acute distress.    Appearance: Normal appearance.  HENT:     Head: Normocephalic.     Right Ear: Tympanic membrane, ear canal and external ear normal.     Left Ear: Tympanic membrane, ear canal and external ear normal.     Nose: Congestion present.     Right Turbinates: Enlarged and swollen.     Left Turbinates: Enlarged and swollen.     Right Sinus: Maxillary sinus tenderness and frontal sinus tenderness present.     Left Sinus: Maxillary sinus tenderness and frontal sinus tenderness present.     Mouth/Throat:     Lips: Pink.     Mouth: Mucous membranes are moist.     Pharynx: Oropharynx is clear. Uvula midline. Postnasal drip present. No pharyngeal swelling, oropharyngeal exudate, posterior oropharyngeal erythema or uvula swelling.     Comments: Cobblestoning present to posterior oropharynx  Eyes:     Extraocular Movements: Extraocular movements intact.     Conjunctiva/sclera: Conjunctivae normal.     Pupils: Pupils are equal, round, and reactive to light.  Cardiovascular:     Rate and Rhythm: Normal rate and regular rhythm.     Pulses: Normal pulses.     Heart sounds: Normal heart sounds.  Pulmonary:     Effort: Pulmonary effort is normal. No respiratory distress.     Breath sounds: Normal breath sounds. No stridor. No wheezing, rhonchi or rales.  Abdominal:     General: Bowel sounds are normal.     Palpations: Abdomen is soft.     Tenderness: There is no abdominal tenderness.  Musculoskeletal:     Cervical back: Normal range of motion.  Lymphadenopathy:     Cervical: No cervical adenopathy.  Skin:    General: Skin is warm and dry.  Neurological:     General: No focal deficit present.     Mental Status: She is alert and oriented to person, place, and time.  Psychiatric:  Mood and Affect: Mood normal.        Behavior: Behavior normal.      UC Treatments / Results  Labs (all labs ordered are listed, but only abnormal results are  displayed) Labs Reviewed - No data to display  EKG   Radiology No results found.  Procedures Procedures (including critical care time)  Medications Ordered in UC Medications - No data to display  Initial Impression / Assessment and Plan / UC Course  I have reviewed the triage vital signs and the nursing notes.  Pertinent labs & imaging results that were available during my care of the patient were reviewed by me and considered in my medical decision making (see chart for details).  On exam, lung sounds are clear throughout, room air sats at 95%.  Patient does have moderate frontal and maxillary sinus tenderness, symptoms have been present for more than 1 week.  Will treat for acute pansinusitis with Augmentin 875/125 mg tablets, and Promethazine DM for her cough.  Supportive care recommendations were provided and discussed with the patient to include fluids, rest, over-the-counter analgesics, normal saline nasal spray.  Patient was given indications regarding when follow-up be necessary.  Patient was in agreement with this plan of care and verbalizes understanding.  All questions were answered.  Patient stable for discharge.  Final Clinical Impressions(s) / UC Diagnoses   Final diagnoses:  Acute pansinusitis, recurrence not specified     Discharge Instructions      Take medication as directed. Increase fluids and get plenty of rest. May take over-the-counter Ibuprofen or Tylenol as needed for pain, fever, or general discomfort. Recommend normal saline nasal spray to help with nasal congestion throughout the day. For your cough, it may be helpful to use a humidifier at bedtime during sleep. If symptoms fail to improve with this treatment, you may follow-up in this clinic or with your primary care physician for further evaluation. Follow-up as needed.     ED Prescriptions     Medication Sig Dispense Auth. Provider   amoxicillin-clavulanate (AUGMENTIN) 875-125 MG tablet  Take 1 tablet by mouth every 12 (twelve) hours. 14 tablet Leath-Warren, Sadie Haber, NP   promethazine-dextromethorphan (PROMETHAZINE-DM) 6.25-15 MG/5ML syrup Take 5 mLs by mouth 4 (four) times daily as needed. 118 mL Leath-Warren, Sadie Haber, NP      PDMP not reviewed this encounter.   Abran Cantor, NP 05/30/23 1123

## 2023-06-04 ENCOUNTER — Other Ambulatory Visit: Payer: Self-pay

## 2023-06-04 ENCOUNTER — Encounter: Payer: Self-pay | Admitting: Internal Medicine

## 2023-06-04 ENCOUNTER — Ambulatory Visit: Payer: Medicare PPO | Admitting: Internal Medicine

## 2023-06-04 VITALS — BP 117/69 | HR 81 | Ht 63.0 in | Wt 112.0 lb

## 2023-06-04 DIAGNOSIS — Z1159 Encounter for screening for other viral diseases: Secondary | ICD-10-CM

## 2023-06-04 DIAGNOSIS — E785 Hyperlipidemia, unspecified: Secondary | ICD-10-CM | POA: Diagnosis not present

## 2023-06-04 DIAGNOSIS — Z131 Encounter for screening for diabetes mellitus: Secondary | ICD-10-CM

## 2023-06-04 DIAGNOSIS — Z1321 Encounter for screening for nutritional disorder: Secondary | ICD-10-CM | POA: Diagnosis not present

## 2023-06-04 DIAGNOSIS — J0140 Acute pansinusitis, unspecified: Secondary | ICD-10-CM | POA: Insufficient documentation

## 2023-06-04 DIAGNOSIS — F411 Generalized anxiety disorder: Secondary | ICD-10-CM | POA: Diagnosis not present

## 2023-06-04 DIAGNOSIS — Z1329 Encounter for screening for other suspected endocrine disorder: Secondary | ICD-10-CM | POA: Diagnosis not present

## 2023-06-04 NOTE — Patient Instructions (Signed)
 It was a pleasure to see you today.  Thank you for giving us  the opportunity to be involved in your care.  Below is a brief recap of your visit and next steps.  We will plan to see you again in 1 year.  Summary You have established care today No medication changes were made We will tentatively plan for follow up in one year

## 2023-06-04 NOTE — Assessment & Plan Note (Signed)
She endorses a history of generalized anxiety disorder.  Adequately controlled with Lexapro 10 mg daily.  She is potentially interested in tapering off of Lexapro at a future date.  No medication changes were made today.

## 2023-06-04 NOTE — Progress Notes (Signed)
 New Patient Office Visit  Subjective    Patient ID: Morgan Harrison, female    DOB: Oct 02, 1955  Age: 67 y.o. MRN: [id number]  CC:  Chief Complaint  Patient presents with   Establish Care    Patient is here to establish care. Complains of sinus infection for two weeks, augmentin  for 5 days but still feeling sick.     HPI Morgan Harrison presents to establish care she is a 35 year old woman who endorses a past medical history significant for hyperlipidemia and anxiety.  Previously followed by Morgan Hall, MD. Morgan Harrison reports feeling fairly well today.  She is gradually recovering from a recent sinus infection.  She does not have any acute concerns to discuss today aside from desiring to establish care.  Morgan Harrison is retired.  She denies tobacco, alcohol, and illicit drug use.  Family medical history is significant for idiopathic peripheral neuropathy.  Chronic medical conditions and outstanding preventative care items discussed today are individually addressed in A/P below.   Outpatient Encounter Medications as of 06/04/2023  Medication Sig   amoxicillin -clavulanate (AUGMENTIN ) 875-125 MG tablet Take 1 tablet by mouth every 12 (twelve) hours.   aspirin 81 MG tablet Take 81 mg by mouth daily.   B Complex Vitamins (VITAMIN B COMPLEX PO) Take by mouth.   Cholecalciferol (VITAMIN D3 PO) Take 4,000 Int'l Units by mouth.   escitalopram  (LEXAPRO ) 10 MG tablet TAKE (1) TABLET BY MOUTH ONCE DAILY.   Estradiol  10 MCG TABS vaginal tablet Place one tablet vaginally 2 x a week at hs   naftifine (NAFTIN) 1 % cream Apply topically daily. Naftifine hydrochloride 2%   rosuvastatin (CRESTOR) 5 MG tablet Take by mouth.   valACYclovir  (VALTREX ) 1000 MG tablet SMARTSIG:1 Tablet(s) By Mouth Every 12 Hours   [DISCONTINUED] promethazine -dextromethorphan (PROMETHAZINE -DM) 6.25-15 MG/5ML syrup Take 5 mLs by mouth 4 (four) times daily as needed.   No facility-administered encounter medications on file as of  06/04/2023.    Past Medical History:  Diagnosis Date   Abnormal Pap smear of cervix    just repeat done   Anxiety and depression    Fibroid    Osteopenia    Shingles     Past Surgical History:  Procedure Laterality Date   CESAREAN SECTION  1993   ENDOMETRIAL BIOPSY     x2   TONSILLECTOMY  1963   TUBAL LIGATION  1993   BTL    Family History  Problem Relation Age of Onset   Neuropathy Father        idiopathic    Social History   Socioeconomic History   Marital status: Married    Spouse name: Ted   Number of children: 3   Years of education: MA   Highest education level: Not on file  Occupational History    Employer: [employer]    Comment: Morgan Harrison  Tobacco Use   Smoking status: Never   Smokeless tobacco: Never  Substance and Sexual Activity   Alcohol use: No   Drug use: No   Sexual activity: Yes    Partners: Male    Birth control/protection: Surgical, Post-menopausal    Comment: btl, less than 5, after 17, 1 STD, no abnormal pap  Other Topics Concern   Not on file  Social History Narrative   Patient lives at home with her spouse.   Caffeine Use: 1 cup daily   Social Drivers of Corporate Investment Banker Strain: Not on file  Food  Insecurity: Not on file  Transportation Needs: Not on file  Physical Activity: Not on file  Stress: Not on file  Social Connections: Unknown (10/16/2021)   Received from [facility], [facility]   Social Network    Social Network: Not on file  Intimate Partner Violence: Unknown (09/07/2021)   Received from [facility], [facility]   HITS    Physically Hurt: Not on file    Insult or Talk Down To: Not on file    Threaten Physical Harm: Not on file    Scream or Curse: Not on file   Review of Systems  Constitutional:  Negative for chills, fever and malaise/fatigue.  HENT:  Positive for congestion, ear pain and sinus pain. Negative for sore throat.   Respiratory:  Positive for cough. Negative for  sputum production and shortness of breath.   Cardiovascular:  Negative for chest pain, palpitations and leg swelling.  Gastrointestinal:  Negative for abdominal pain, blood in stool, constipation, diarrhea, nausea and vomiting.  Genitourinary:  Negative for dysuria and hematuria.  Musculoskeletal:  Negative for myalgias.  Skin:  Negative for itching and rash.  Neurological:  Negative for dizziness and headaches.  Psychiatric/Behavioral:  Negative for depression and suicidal ideas.    Objective    BP 117/69   Pulse 81   Ht 5' 3 (1.6 m)   Wt 112 lb (50.8 kg)   LMP 09/19/2011   SpO2 98%   BMI 19.84 kg/m   Physical Exam Vitals reviewed.  Constitutional:      General: She is not in acute distress.    Appearance: Normal appearance. She is not toxic-appearing.  HENT:     Head: Normocephalic and atraumatic.     Right Ear: External ear normal.     Left Ear: External ear normal.     Nose: Congestion and rhinorrhea present.     Mouth/Throat:     Mouth: Mucous membranes are moist.     Pharynx: Oropharynx is clear. No oropharyngeal exudate or posterior oropharyngeal erythema.  Eyes:     General: No scleral icterus.    Extraocular Movements: Extraocular movements intact.     Conjunctiva/sclera: Conjunctivae normal.     Pupils: Pupils are equal, round, and reactive to light.  Cardiovascular:     Rate and Rhythm: Normal rate and regular rhythm.     Pulses: Normal pulses.     Heart sounds: Normal heart sounds. No murmur heard.    No friction rub. No gallop.  Pulmonary:     Effort: Pulmonary effort is normal.     Breath sounds: Normal breath sounds. No wheezing, rhonchi or rales.  Abdominal:     General: Abdomen is flat. Bowel sounds are normal. There is no distension.     Palpations: Abdomen is soft.     Tenderness: There is no abdominal tenderness.  Musculoskeletal:        General: No swelling. Normal range of motion.     Cervical back: Normal range of motion.     Right lower  leg: No edema.     Left lower leg: No edema.  Lymphadenopathy:     Cervical: No cervical adenopathy.  Skin:    General: Skin is warm and dry.     Capillary Refill: Capillary refill takes less than 2 seconds.     Coloration: Skin is not jaundiced.  Neurological:     General: No focal deficit present.     Mental Status: She is alert and oriented to person, place,  and time.  Psychiatric:        Mood and Affect: Mood normal.        Behavior: Behavior normal.    Assessment & Plan:   Problem List Items Addressed This Visit       Acute pansinusitis   Recent urgent care presentation for acute pansinusitis.  She was prescribed Augmentin  x 7 days.  She continues to experience sinus congestion but feels that symptoms are gradually improving.  We reviewed appropriate supportive care measures, including routine use of nasal saline rinse and fluticasone nasal spray.  We also discussed as needed use of Sudafed.  She will return to care if symptoms worsen or do not continue to improve.      GAD (generalized anxiety disorder) - Primary   She endorses a history of generalized anxiety disorder.  Adequately controlled with Lexapro  10 mg daily.  She is potentially interested in tapering off of Lexapro  at a future date.  No medication changes were made today.      Hyperlipidemia   Currently prescribed rosuvastatin 5 mg weekly.  She endorses a history of intolerance with higher doses.  Repeat lipid panel ordered today.      Return in about 1 year (around 06/03/2024).   Manus FORBES Fireman, MD

## 2023-06-04 NOTE — Assessment & Plan Note (Signed)
 Recent urgent care presentation for acute pansinusitis.  She was prescribed Augmentin  x 7 days.  She continues to experience sinus congestion but feels that symptoms are gradually improving.  We reviewed appropriate supportive care measures, including routine use of nasal saline rinse and fluticasone nasal spray.  We also discussed as needed use of Sudafed.  She will return to care if symptoms worsen or do not continue to improve.

## 2023-06-04 NOTE — Assessment & Plan Note (Signed)
Currently prescribed rosuvastatin 5 mg weekly.  She endorses a history of intolerance with higher doses.  Repeat lipid panel ordered today.

## 2023-06-05 LAB — CMP14+EGFR
ALT: 17 [IU]/L (ref 0–32)
AST: 20 [IU]/L (ref 0–40)
Albumin: 4.5 g/dL (ref 3.9–4.9)
Alkaline Phosphatase: 90 [IU]/L (ref 44–121)
BUN/Creatinine Ratio: 15 (ref 12–28)
BUN: 9 mg/dL (ref 8–27)
Bilirubin Total: 0.3 mg/dL (ref 0.0–1.2)
CO2: 22 mmol/L (ref 20–29)
Calcium: 9.7 mg/dL (ref 8.7–10.3)
Chloride: 97 mmol/L (ref 96–106)
Creatinine, Ser: 0.62 mg/dL (ref 0.57–1.00)
Globulin, Total: 2.5 g/dL (ref 1.5–4.5)
Glucose: 82 mg/dL (ref 70–99)
Potassium: 5.1 mmol/L (ref 3.5–5.2)
Sodium: 136 mmol/L (ref 134–144)
Total Protein: 7 g/dL (ref 6.0–8.5)
eGFR: 98 mL/min/{1.73_m2} (ref >59)

## 2023-06-05 LAB — CBC WITH DIFFERENTIAL/PLATELET
Basophils Absolute: 0 10*3/uL (ref 0.0–0.2)
Basos: 0 %
EOS (ABSOLUTE): 0.2 10*3/uL (ref 0.0–0.4)
Eos: 3 %
Hematocrit: 39.9 % (ref 34.0–46.6)
Hemoglobin: 12.6 g/dL (ref 11.1–15.9)
Immature Grans (Abs): 0 10*3/uL (ref 0.0–0.1)
Immature Granulocytes: 0 %
Lymphocytes Absolute: 1.6 10*3/uL (ref 0.7–3.1)
Lymphs: 29 %
MCH: 27.9 pg (ref 26.6–33.0)
MCHC: 31.6 g/dL (ref 31.5–35.7)
MCV: 89 fL (ref 79–97)
Monocytes Absolute: 0.6 10*3/uL (ref 0.1–0.9)
Monocytes: 11 %
Neutrophils Absolute: 3.2 10*3/uL (ref 1.4–7.0)
Neutrophils: 57 %
Platelets: 314 10*3/uL (ref 150–450)
RBC: 4.51 x10E6/uL (ref 3.77–5.28)
RDW: 12.4 % (ref 11.7–15.4)
WBC: 5.6 10*3/uL (ref 3.4–10.8)

## 2023-06-05 LAB — LIPID PANEL
Chol/HDL Ratio: 4.4 {ratio} (ref 0.0–4.4)
Cholesterol, Total: 151 mg/dL (ref 100–199)
HDL: 34 mg/dL — ABNORMAL LOW (ref >39)
LDL Chol Calc (NIH): 101 mg/dL — ABNORMAL HIGH (ref 0–99)
Triglycerides: 83 mg/dL (ref 0–149)
VLDL Cholesterol Cal: 16 mg/dL (ref 5–40)

## 2023-06-05 LAB — TSH+FREE T4
Free T4: 1.23 ng/dL (ref 0.82–1.77)
TSH: 1.89 u[IU]/mL (ref 0.450–4.500)

## 2023-06-05 LAB — HCV INTERPRETATION

## 2023-06-05 LAB — HEMOGLOBIN A1C
Est. average glucose Bld gHb Est-mCnc: 114 mg/dL
Hgb A1c MFr Bld: 5.6 % (ref 4.8–5.6)

## 2023-06-05 LAB — HCV AB W REFLEX TO QUANT PCR: HCV Ab: NONREACTIVE

## 2023-06-05 LAB — B12 AND FOLATE PANEL
Folate: >20 ng/mL (ref >3.0)
Vitamin B-12: 732 pg/mL (ref 232–1245)

## 2023-06-05 LAB — VITAMIN D 25 HYDROXY (VIT D DEFICIENCY, FRACTURES): Vit D, 25-Hydroxy: 57.2 ng/mL (ref 30.0–100.0)

## 2023-06-11 ENCOUNTER — Telehealth: Payer: Medicare PPO | Admitting: Internal Medicine

## 2023-06-11 ENCOUNTER — Encounter: Payer: Self-pay | Admitting: Internal Medicine

## 2023-06-11 DIAGNOSIS — J0140 Acute pansinusitis, unspecified: Secondary | ICD-10-CM

## 2023-06-11 MED ORDER — LEVOFLOXACIN 750 MG PO TABS: 750.0000 mg | ORAL_TABLET | Freq: Every day | ORAL | 0 refills | Status: AC

## 2023-06-11 NOTE — Assessment & Plan Note (Signed)
 Evaluated today through video encounter for follow-up of acute pansinusitis.  Previously treated with Augmentin  without significant symptom improvement.  She continues to endorse sinus congestion with yellow nasal secretions and a cough that is productive of yellow sputum.  She additionally endorses a headache and occasional shortness of breath. -Levaquin  750 mg daily x 5 days prescribed in the setting of bacterial sinusitis -Continue current supportive care measures -She was instructed to return to care if symptoms fail to improve with additional antibiotic treatment.  Would consider imaging at that time for further evaluation.

## 2023-06-11 NOTE — Progress Notes (Signed)
 Virtual Visit via Video Note  I connected with Morgan Harrison on 06/11/23 at  2:20 PM EST by a video enabled telemedicine application and verified that I am speaking with the correct person using two identifiers.  Patient Location: Home Provider Location: Office/Clinic  I discussed the limitations, risks, security, and privacy concerns of performing an evaluation and management service by video and the availability of in person appointments. I also discussed with the patient that there may be a patient responsible charge related to this service. The patient expressed understanding and agreed to proceed.  Subjective: PCP: Morgan Morgan BRAVO, MD  Chief Complaint  Patient presents with   Sinusitis    Sinus infection has completed antbiotics   Morgan Harrison has been evaluated today through acute video encounter for persistent sinusitis.  Previously evaluated by me on 12/31 as a new patient presenting to establish care.  At that time she reported taking Augmentin  for treatment of acute pansinusitis.  Today she states that symptoms have not significantly improved.  She continues to endorse sinus congestion with yellow-colored nasal secretions.  She also has a cough productive of yellow sputum.  Morgan Harrison endorses a headache as well.  Denies fever/chills, nausea/vomiting, and diarrhea.  She is occasionally short of breath.  Morgan Harrison has been using Sudafed and nasal steroid spray for symptom relief.  ROS: Per HPI  Current Outpatient Medications:    levofloxacin  (LEVAQUIN ) 750 MG tablet, Take 1 tablet (750 mg total) by mouth daily for 5 doses., Disp: 5 tablet, Rfl: 0   amoxicillin -clavulanate (AUGMENTIN ) 875-125 MG tablet, Take 1 tablet by mouth every 12 (twelve) hours., Disp: 14 tablet, Rfl: 0   aspirin 81 MG tablet, Take 81 mg by mouth daily., Disp: , Rfl:    B Complex Vitamins (VITAMIN B COMPLEX PO), Take by mouth., Disp: , Rfl:    Cholecalciferol (VITAMIN D3 PO), Take 4,000 Int'l Units by  mouth., Disp: , Rfl:    escitalopram  (LEXAPRO ) 10 MG tablet, TAKE (1) TABLET BY MOUTH ONCE DAILY., Disp: 90 tablet, Rfl: 3   Estradiol  10 MCG TABS vaginal tablet, Place one tablet vaginally 2 x a week at hs, Disp: 24 tablet, Rfl: 3   naftifine (NAFTIN) 1 % cream, Apply topically daily. Naftifine hydrochloride 2%, Disp: , Rfl:    rosuvastatin (CRESTOR) 5 MG tablet, Take by mouth., Disp: , Rfl:    valACYclovir  (VALTREX ) 1000 MG tablet, SMARTSIG:1 Tablet(s) By Mouth Every 12 Hours, Disp: , Rfl:   Assessment and Plan:  Acute pansinusitis, recurrence not specified Assessment & Plan: Evaluated today through video encounter for follow-up of acute pansinusitis.  Previously treated with Augmentin  without significant symptom improvement.  She continues to endorse sinus congestion with yellow nasal secretions and a cough that is productive of yellow sputum.  She additionally endorses a headache and occasional shortness of breath. -Levaquin  750 mg daily x 5 days prescribed in the setting of bacterial sinusitis -Continue current supportive care measures -She was instructed to return to care if symptoms fail to improve with additional antibiotic treatment.  Would consider imaging at that time for further evaluation.  Follow Up Instructions: Return if symptoms worsen or fail to improve.   I discussed the assessment and treatment plan with the patient. The patient was provided an opportunity to ask questions, and all were answered. The patient agreed with the plan and demonstrated an understanding of the instructions.   The patient was advised to call back or seek an in-person evaluation if the  symptoms worsen or if the condition fails to improve as anticipated.  The above assessment and management plan was discussed with the patient. The patient verbalized understanding of and has agreed to the management plan.   Morgan FORBES Fireman, MD

## 2023-06-14 ENCOUNTER — Encounter: Payer: Self-pay | Admitting: Internal Medicine

## 2023-06-20 DIAGNOSIS — Z1283 Encounter for screening for malignant neoplasm of skin: Secondary | ICD-10-CM | POA: Diagnosis not present

## 2023-06-20 DIAGNOSIS — D225 Melanocytic nevi of trunk: Secondary | ICD-10-CM | POA: Diagnosis not present

## 2023-07-10 ENCOUNTER — Other Ambulatory Visit: Payer: Self-pay | Admitting: Internal Medicine

## 2023-07-10 DIAGNOSIS — M542 Cervicalgia: Secondary | ICD-10-CM | POA: Diagnosis not present

## 2023-07-10 DIAGNOSIS — M9902 Segmental and somatic dysfunction of thoracic region: Secondary | ICD-10-CM | POA: Diagnosis not present

## 2023-07-10 DIAGNOSIS — M9901 Segmental and somatic dysfunction of cervical region: Secondary | ICD-10-CM | POA: Diagnosis not present

## 2023-07-10 DIAGNOSIS — E782 Mixed hyperlipidemia: Secondary | ICD-10-CM

## 2023-07-10 DIAGNOSIS — M9903 Segmental and somatic dysfunction of lumbar region: Secondary | ICD-10-CM | POA: Diagnosis not present

## 2023-09-02 DIAGNOSIS — M542 Cervicalgia: Secondary | ICD-10-CM | POA: Diagnosis not present

## 2023-09-02 DIAGNOSIS — M9901 Segmental and somatic dysfunction of cervical region: Secondary | ICD-10-CM | POA: Diagnosis not present

## 2023-09-02 DIAGNOSIS — M9902 Segmental and somatic dysfunction of thoracic region: Secondary | ICD-10-CM | POA: Diagnosis not present

## 2023-09-02 DIAGNOSIS — M9903 Segmental and somatic dysfunction of lumbar region: Secondary | ICD-10-CM | POA: Diagnosis not present

## 2023-10-23 DIAGNOSIS — M542 Cervicalgia: Secondary | ICD-10-CM | POA: Diagnosis not present

## 2023-10-23 DIAGNOSIS — M9901 Segmental and somatic dysfunction of cervical region: Secondary | ICD-10-CM | POA: Diagnosis not present

## 2023-10-23 DIAGNOSIS — M9903 Segmental and somatic dysfunction of lumbar region: Secondary | ICD-10-CM | POA: Diagnosis not present

## 2023-10-23 DIAGNOSIS — M9902 Segmental and somatic dysfunction of thoracic region: Secondary | ICD-10-CM | POA: Diagnosis not present

## 2023-11-27 DIAGNOSIS — H02411 Mechanical ptosis of right eyelid: Secondary | ICD-10-CM | POA: Diagnosis not present

## 2023-11-27 DIAGNOSIS — H02831 Dermatochalasis of right upper eyelid: Secondary | ICD-10-CM | POA: Diagnosis not present

## 2023-11-27 DIAGNOSIS — H57813 Brow ptosis, bilateral: Secondary | ICD-10-CM | POA: Diagnosis not present

## 2023-11-27 DIAGNOSIS — H0279 Other degenerative disorders of eyelid and periocular area: Secondary | ICD-10-CM | POA: Diagnosis not present

## 2023-11-27 DIAGNOSIS — Z01818 Encounter for other preprocedural examination: Secondary | ICD-10-CM | POA: Diagnosis not present

## 2023-11-27 DIAGNOSIS — H53483 Generalized contraction of visual field, bilateral: Secondary | ICD-10-CM | POA: Diagnosis not present

## 2023-11-27 DIAGNOSIS — H02834 Dermatochalasis of left upper eyelid: Secondary | ICD-10-CM | POA: Diagnosis not present

## 2023-11-27 DIAGNOSIS — H02413 Mechanical ptosis of bilateral eyelids: Secondary | ICD-10-CM | POA: Diagnosis not present

## 2023-11-27 DIAGNOSIS — H02412 Mechanical ptosis of left eyelid: Secondary | ICD-10-CM | POA: Diagnosis not present

## 2023-12-11 DIAGNOSIS — L608 Other nail disorders: Secondary | ICD-10-CM | POA: Diagnosis not present

## 2023-12-11 DIAGNOSIS — L0202 Furuncle of face: Secondary | ICD-10-CM | POA: Diagnosis not present

## 2023-12-11 DIAGNOSIS — B9689 Other specified bacterial agents as the cause of diseases classified elsewhere: Secondary | ICD-10-CM | POA: Diagnosis not present

## 2023-12-16 DIAGNOSIS — M542 Cervicalgia: Secondary | ICD-10-CM | POA: Diagnosis not present

## 2023-12-16 DIAGNOSIS — M9902 Segmental and somatic dysfunction of thoracic region: Secondary | ICD-10-CM | POA: Diagnosis not present

## 2023-12-16 DIAGNOSIS — M9901 Segmental and somatic dysfunction of cervical region: Secondary | ICD-10-CM | POA: Diagnosis not present

## 2023-12-16 DIAGNOSIS — M9903 Segmental and somatic dysfunction of lumbar region: Secondary | ICD-10-CM | POA: Diagnosis not present

## 2023-12-16 DIAGNOSIS — M546 Pain in thoracic spine: Secondary | ICD-10-CM | POA: Diagnosis not present

## 2023-12-29 ENCOUNTER — Other Ambulatory Visit: Payer: Self-pay | Admitting: Obstetrics and Gynecology

## 2023-12-29 DIAGNOSIS — N952 Postmenopausal atrophic vaginitis: Secondary | ICD-10-CM

## 2023-12-29 DIAGNOSIS — N941 Unspecified dyspareunia: Secondary | ICD-10-CM

## 2023-12-30 NOTE — Telephone Encounter (Signed)
 Med refill request: estradiol  10 mcg tabs Last AEX: 01/08/23 Next AEX: 01/22/24 Last MMG (if hormonal med): 03/11/23 Refill authorized: Please Advise? Last refill 01/08/23 #24 with 3 refills

## 2024-01-01 DIAGNOSIS — L0202 Furuncle of face: Secondary | ICD-10-CM | POA: Diagnosis not present

## 2024-01-01 DIAGNOSIS — B9689 Other specified bacterial agents as the cause of diseases classified elsewhere: Secondary | ICD-10-CM | POA: Diagnosis not present

## 2024-01-01 DIAGNOSIS — L2989 Other pruritus: Secondary | ICD-10-CM | POA: Diagnosis not present

## 2024-01-06 ENCOUNTER — Other Ambulatory Visit: Payer: Self-pay

## 2024-01-06 DIAGNOSIS — Z8659 Personal history of other mental and behavioral disorders: Secondary | ICD-10-CM

## 2024-01-06 NOTE — Telephone Encounter (Signed)
 Med refill request: escitalopram  (Lexapro ) Last AEX: 01/08/23 BS Next AEX: 01/22/24 BS Last MMG (if hormonal med) 03/11/23 Refill authorized: Last Rx sent #90 with 3 refills on 01/08/23 BS. Please approve or deny

## 2024-01-07 MED ORDER — ESCITALOPRAM OXALATE 10 MG PO TABS: ORAL_TABLET | ORAL | 0 refills | Status: DC

## 2024-01-14 ENCOUNTER — Encounter: Payer: Medicare Other | Admitting: Obstetrics and Gynecology

## 2024-01-16 DIAGNOSIS — X58XXXA Exposure to other specified factors, initial encounter: Secondary | ICD-10-CM | POA: Diagnosis not present

## 2024-01-16 DIAGNOSIS — S52501A Unspecified fracture of the lower end of right radius, initial encounter for closed fracture: Secondary | ICD-10-CM | POA: Diagnosis not present

## 2024-01-17 DIAGNOSIS — M79631 Pain in right forearm: Secondary | ICD-10-CM | POA: Diagnosis not present

## 2024-01-17 DIAGNOSIS — S52501A Unspecified fracture of the lower end of right radius, initial encounter for closed fracture: Secondary | ICD-10-CM | POA: Diagnosis not present

## 2024-01-21 DIAGNOSIS — X58XXXA Exposure to other specified factors, initial encounter: Secondary | ICD-10-CM | POA: Diagnosis not present

## 2024-01-21 DIAGNOSIS — S52571A Other intraarticular fracture of lower end of right radius, initial encounter for closed fracture: Secondary | ICD-10-CM | POA: Diagnosis not present

## 2024-01-21 DIAGNOSIS — Y999 Unspecified external cause status: Secondary | ICD-10-CM | POA: Diagnosis not present

## 2024-01-21 DIAGNOSIS — G8918 Other acute postprocedural pain: Secondary | ICD-10-CM | POA: Diagnosis not present

## 2024-01-22 ENCOUNTER — Encounter: Admitting: Obstetrics and Gynecology

## 2024-01-27 ENCOUNTER — Other Ambulatory Visit (HOSPITAL_COMMUNITY): Payer: Self-pay | Admitting: Obstetrics and Gynecology

## 2024-01-27 DIAGNOSIS — Z1231 Encounter for screening mammogram for malignant neoplasm of breast: Secondary | ICD-10-CM

## 2024-01-28 ENCOUNTER — Telehealth: Payer: Self-pay

## 2024-01-28 ENCOUNTER — Other Ambulatory Visit: Payer: Self-pay | Admitting: Obstetrics and Gynecology

## 2024-01-28 DIAGNOSIS — Z8659 Personal history of other mental and behavioral disorders: Secondary | ICD-10-CM

## 2024-01-28 MED ORDER — ESCITALOPRAM OXALATE 10 MG PO TABS
ORAL_TABLET | ORAL | 0 refills | Status: DC
Start: 2024-01-28 — End: 2024-04-14

## 2024-01-28 NOTE — Telephone Encounter (Signed)
 I sent in a refill for Lexapro  10 mg daily.  #90, RF none.

## 2024-01-28 NOTE — Telephone Encounter (Signed)
 Med refill request:  Escitalopram  (Lexapro ) 10 mg tablet Started: 01/07/24 - #30 tablet with 0 refills   Last AEX: 01/08/23 Next B&P: 05/20/24 Refill authorized? Please Advise.

## 2024-01-29 ENCOUNTER — Other Ambulatory Visit: Payer: Self-pay

## 2024-01-29 DIAGNOSIS — Z8659 Personal history of other mental and behavioral disorders: Secondary | ICD-10-CM

## 2024-01-29 NOTE — Telephone Encounter (Signed)
 Thank you :)

## 2024-01-29 NOTE — Telephone Encounter (Signed)
 Request already addressed. Please disregard.

## 2024-01-30 DIAGNOSIS — S52501D Unspecified fracture of the lower end of right radius, subsequent encounter for closed fracture with routine healing: Secondary | ICD-10-CM | POA: Diagnosis not present

## 2024-01-30 DIAGNOSIS — Z4789 Encounter for other orthopedic aftercare: Secondary | ICD-10-CM | POA: Diagnosis not present

## 2024-02-12 DIAGNOSIS — M9903 Segmental and somatic dysfunction of lumbar region: Secondary | ICD-10-CM | POA: Diagnosis not present

## 2024-02-12 DIAGNOSIS — M542 Cervicalgia: Secondary | ICD-10-CM | POA: Diagnosis not present

## 2024-02-12 DIAGNOSIS — M9901 Segmental and somatic dysfunction of cervical region: Secondary | ICD-10-CM | POA: Diagnosis not present

## 2024-02-12 DIAGNOSIS — M62830 Muscle spasm of back: Secondary | ICD-10-CM | POA: Diagnosis not present

## 2024-02-12 DIAGNOSIS — M9902 Segmental and somatic dysfunction of thoracic region: Secondary | ICD-10-CM | POA: Diagnosis not present

## 2024-02-12 DIAGNOSIS — M546 Pain in thoracic spine: Secondary | ICD-10-CM | POA: Diagnosis not present

## 2024-02-17 DIAGNOSIS — M25631 Stiffness of right wrist, not elsewhere classified: Secondary | ICD-10-CM | POA: Diagnosis not present

## 2024-02-17 DIAGNOSIS — Z4789 Encounter for other orthopedic aftercare: Secondary | ICD-10-CM | POA: Diagnosis not present

## 2024-02-17 DIAGNOSIS — S52501A Unspecified fracture of the lower end of right radius, initial encounter for closed fracture: Secondary | ICD-10-CM | POA: Diagnosis not present

## 2024-02-17 DIAGNOSIS — M25531 Pain in right wrist: Secondary | ICD-10-CM | POA: Diagnosis not present

## 2024-02-27 DIAGNOSIS — M25631 Stiffness of right wrist, not elsewhere classified: Secondary | ICD-10-CM | POA: Diagnosis not present

## 2024-03-05 DIAGNOSIS — M25631 Stiffness of right wrist, not elsewhere classified: Secondary | ICD-10-CM | POA: Diagnosis not present

## 2024-03-10 DIAGNOSIS — M25631 Stiffness of right wrist, not elsewhere classified: Secondary | ICD-10-CM | POA: Diagnosis not present

## 2024-03-13 ENCOUNTER — Ambulatory Visit (HOSPITAL_COMMUNITY)
Admission: RE | Admit: 2024-03-13 | Discharge: 2024-03-13 | Disposition: A | Discharge status: Home / Self Care | Source: Ambulatory Visit | Attending: Obstetrics and Gynecology | Admitting: Obstetrics and Gynecology

## 2024-03-13 DIAGNOSIS — Z1231 Encounter for screening mammogram for malignant neoplasm of breast: Secondary | ICD-10-CM | POA: Diagnosis not present

## 2024-03-17 DIAGNOSIS — M25631 Stiffness of right wrist, not elsewhere classified: Secondary | ICD-10-CM | POA: Diagnosis not present

## 2024-03-18 ENCOUNTER — Ambulatory Visit: Payer: Self-pay | Admitting: Obstetrics and Gynecology

## 2024-03-19 ENCOUNTER — Encounter: Payer: Self-pay | Admitting: Family Medicine

## 2024-03-19 ENCOUNTER — Encounter: Admitting: Family Medicine

## 2024-03-20 DIAGNOSIS — Z4789 Encounter for other orthopedic aftercare: Secondary | ICD-10-CM | POA: Diagnosis not present

## 2024-03-20 DIAGNOSIS — S52501A Unspecified fracture of the lower end of right radius, initial encounter for closed fracture: Secondary | ICD-10-CM | POA: Diagnosis not present

## 2024-03-20 DIAGNOSIS — S52501D Unspecified fracture of the lower end of right radius, subsequent encounter for closed fracture with routine healing: Secondary | ICD-10-CM | POA: Diagnosis not present

## 2024-03-20 NOTE — Telephone Encounter (Signed)
 No further action needed.

## 2024-03-25 ENCOUNTER — Encounter: Admitting: Family Medicine

## 2024-03-31 DIAGNOSIS — M25631 Stiffness of right wrist, not elsewhere classified: Secondary | ICD-10-CM | POA: Diagnosis not present

## 2024-04-01 ENCOUNTER — Ambulatory Visit: Admitting: Family Medicine

## 2024-04-01 DIAGNOSIS — H2513 Age-related nuclear cataract, bilateral: Secondary | ICD-10-CM | POA: Diagnosis not present

## 2024-04-01 DIAGNOSIS — Z9889 Other specified postprocedural states: Secondary | ICD-10-CM | POA: Diagnosis not present

## 2024-04-09 ENCOUNTER — Encounter: Admitting: Obstetrics and Gynecology

## 2024-04-14 ENCOUNTER — Other Ambulatory Visit: Payer: Self-pay | Admitting: Obstetrics and Gynecology

## 2024-04-14 DIAGNOSIS — N941 Unspecified dyspareunia: Secondary | ICD-10-CM

## 2024-04-14 DIAGNOSIS — Z8659 Personal history of other mental and behavioral disorders: Secondary | ICD-10-CM

## 2024-04-14 DIAGNOSIS — N952 Postmenopausal atrophic vaginitis: Secondary | ICD-10-CM

## 2024-04-14 NOTE — Telephone Encounter (Signed)
 Medication refill request: lexapro  10mg  & estradiol  vaginal tabs Last AEX:  01-08-23 Next AEX: 05-20-24 Last MMG (if hormonal medication request): 03-13-24 birads 1:neg Refill authorized: 90 day supply with 0 refills sent in for both.

## 2024-04-15 DIAGNOSIS — M25631 Stiffness of right wrist, not elsewhere classified: Secondary | ICD-10-CM | POA: Diagnosis not present

## 2024-04-22 DIAGNOSIS — M9903 Segmental and somatic dysfunction of lumbar region: Secondary | ICD-10-CM | POA: Diagnosis not present

## 2024-04-22 DIAGNOSIS — M542 Cervicalgia: Secondary | ICD-10-CM | POA: Diagnosis not present

## 2024-04-22 DIAGNOSIS — M9901 Segmental and somatic dysfunction of cervical region: Secondary | ICD-10-CM | POA: Diagnosis not present

## 2024-04-22 DIAGNOSIS — M9902 Segmental and somatic dysfunction of thoracic region: Secondary | ICD-10-CM | POA: Diagnosis not present

## 2024-05-08 DIAGNOSIS — Z4789 Encounter for other orthopedic aftercare: Secondary | ICD-10-CM | POA: Diagnosis not present

## 2024-05-08 DIAGNOSIS — S52501A Unspecified fracture of the lower end of right radius, initial encounter for closed fracture: Secondary | ICD-10-CM | POA: Diagnosis not present

## 2024-05-12 ENCOUNTER — Encounter: Payer: Self-pay | Admitting: Family Medicine

## 2024-05-19 NOTE — Progress Notes (Unsigned)
 68 y.o. G3P3 Married Caucasian female here for a breast and pelvic exam.    The patient is also followed for vaginal atrophy and anxiety treatment.  Using Vagifem , which is working well.    Some vaginal odor.   Taking Lexapro  10 mg daily for anxiety and panic. Started tx many years ago.   Tried to wean off, and was not successful.   Taking calcium 600 mg daily.  Broke her arm this summer with a fall.  Had surgery.    PCP: Candise Aleene DEL, MD   Patient's last menstrual period was 09/19/2011.           Sexually active: Yes.    The current method of family planning is tubal ligation.    Menopausal hormone therapy:  Estradiol  vaginal tablet Exercising: Yes.    Walking and weight lifting  Smoker:  no  OB History     Gravida  3   Para  3   Term      Preterm      AB      Living  3      SAB      IAB      Ectopic      Multiple      Live Births  3           HEALTH MAINTENANCE: Last 2 paps: 01/08/23 neg, HR HPV neg, 11/24/18 neg HPV neg  History of abnormal Pap or positive HPV:  yes, repeat was normal. Mammogram:  03/13/24 Breast Density Cat C, BIRADS Cat 1 neg  Colonoscopy:  12/02/19 (care everywhere)  Bone Density:  03/11/23  Result  osteopenic    Immunization History  Administered Date(s) Administered   Moderna SARS-COV2 Booster Vaccination 03/31/2020   Tdap 06/04/2002, 10/07/2014      reports that she has never smoked. She has never used smokeless tobacco. She reports that she does not drink alcohol and does not use drugs.  Past Medical History:  Diagnosis Date   Abnormal Pap smear of cervix    just repeat done   Anxiety and depression    Fibroid    Osteopenia    Right radial fracture    distal-->ORIF 02/2024 [facility])   Shingles     Past Surgical History:  Procedure Laterality Date   CESAREAN SECTION  1993   ENDOMETRIAL BIOPSY     x2   TONSILLECTOMY  1963   TUBAL LIGATION  1993   BTL    Current Outpatient Medications  Medication  Sig Dispense Refill   aspirin 81 MG tablet Take 81 mg by mouth daily.     B Complex Vitamins (VITAMIN B COMPLEX PO) Take by mouth.     Cholecalciferol (VITAMIN D3 PO) Take 4,000 Int'l Units by mouth.     escitalopram  (LEXAPRO ) 10 MG tablet TAKE (1) TABLET BY MOUTH ONCE DAILY. 90 tablet 0   Estradiol  10 MCG TABS vaginal tablet PLACE 1 TABLET VAGINALLY 2 TIMES A WEEK AT BEDTIME. 24 tablet 0   MAGNESIUM PO Take by mouth.     naftifine (NAFTIN) 1 % cream Apply topically daily. Naftifine hydrochloride 2%     rosuvastatin (CRESTOR) 5 MG tablet Take 1 tablet (5 mg total) by mouth once a week. 90 tablet 1   valACYclovir  (VALTREX ) 1000 MG tablet SMARTSIG:1 Tablet(s) By Mouth Every 12 Hours     No current facility-administered medications for this visit.    ALLERGIES: Patient has no known allergies.  Family History  Problem Relation  Age of Onset   Neuropathy Father        idiopathic    Review of Systems  All other systems reviewed and are negative.   PHYSICAL EXAM:  BP 118/72 (BP Location: Left Arm, Patient Position: Sitting)   Pulse 83   Ht 5' 3.25 (1.607 m)   Wt 114 lb (51.7 kg)   LMP 09/19/2011   SpO2 98%   BMI 20.03 kg/m     General appearance: alert, cooperative and appears stated age Head: normocephalic, without obvious abnormality, atraumatic Neck: no adenopathy, supple, symmetrical, trachea midline and thyroid normal to inspection and palpation Lungs: clear to auscultation bilaterally Breasts: normal appearance, no masses or tenderness, No nipple retraction or dimpling, No nipple discharge or bleeding, No axillary adenopathy Heart: regular rate and rhythm Abdomen: soft, non-tender; no masses, no organomegaly Extremities: extremities normal, atraumatic, no cyanosis or edema Skin: skin color, texture, turgor normal. No rashes or lesions Lymph nodes: cervical, supraclavicular, and axillary nodes normal. Neurologic: grossly normal  Pelvic: External genitalia:  no lesions               No abnormal inguinal nodes palpated.              Urethra:  normal appearing urethra with no masses, tenderness or lesions              Bartholins and Skenes: normal                 Vagina: normal appearing vagina with normal color and discharge, no lesions              Cervix: no lesions              Pap taken: no Bimanual Exam:  Uterus:  normal size, contour, position, consistency, mobility, non-tender              Adnexa: no mass, fullness, tenderness              Rectal exam: yes.  Confirms.              Anus:  normal sphincter tone, no lesions  Chaperone was present for exam:  Kari HERO, CMA  ASSESSMENT: Encounter for breast and pelvic exam.  Personal history of other medical treatment.  Vaginal atrophy.  Anxiety.    Encounter for medication monitoring.  Osteopenia.  Menopause.  Right arm fracture.  Traumatic.  Vaginal odor.   PLAN: Mammogram screening discussed. Self breast awareness reviewed. Pap and HRV collected:  no.  Due in 2029 Guidelines for Calcium, Vitamin D , regular exercise program including cardiovascular and weight bearing exercise. Medication refills:  Vagifem .10 mcg pv twice weekly.  #24, RF 3.  I discussed potential effect on breast cancer.  Lexapro  10 mg daily.  #90, RF 3. Nuswab vaginitis.   If has BV, prefers Metrogel.   Dexa at [facility] in Oct. 2026. Labs with PCP.   Follow up:  yearly and prn.     Additional counseling given.  yes. 25 min  total time was spent for this patient encounter, including preparation, face-to-face counseling with the patient, coordination of care, and documentation of the encounter in addition to doing the breast and pelvic exam.

## 2024-05-20 ENCOUNTER — Ambulatory Visit: Admitting: Obstetrics and Gynecology

## 2024-05-20 ENCOUNTER — Encounter: Admitting: Obstetrics and Gynecology

## 2024-05-20 ENCOUNTER — Other Ambulatory Visit (HOSPITAL_COMMUNITY)
Admission: RE | Admit: 2024-05-20 | Discharge: 2024-05-20 | Disposition: A | Source: Ambulatory Visit | Attending: Obstetrics and Gynecology | Admitting: Obstetrics and Gynecology

## 2024-05-20 ENCOUNTER — Encounter: Payer: Self-pay | Admitting: Obstetrics and Gynecology

## 2024-05-20 VITALS — BP 118/72 | HR 83 | Ht 63.25 in | Wt 114.0 lb

## 2024-05-20 DIAGNOSIS — N952 Postmenopausal atrophic vaginitis: Secondary | ICD-10-CM | POA: Diagnosis not present

## 2024-05-20 DIAGNOSIS — N898 Other specified noninflammatory disorders of vagina: Secondary | ICD-10-CM

## 2024-05-20 DIAGNOSIS — N941 Unspecified dyspareunia: Secondary | ICD-10-CM

## 2024-05-20 DIAGNOSIS — Z8659 Personal history of other mental and behavioral disorders: Secondary | ICD-10-CM | POA: Diagnosis not present

## 2024-05-20 DIAGNOSIS — Z8619 Personal history of other infectious and parasitic diseases: Secondary | ICD-10-CM | POA: Diagnosis not present

## 2024-05-20 DIAGNOSIS — Z9289 Personal history of other medical treatment: Secondary | ICD-10-CM | POA: Diagnosis not present

## 2024-05-20 DIAGNOSIS — Z780 Asymptomatic menopausal state: Secondary | ICD-10-CM

## 2024-05-20 DIAGNOSIS — Z01419 Encounter for gynecological examination (general) (routine) without abnormal findings: Secondary | ICD-10-CM

## 2024-05-20 MED ORDER — ESCITALOPRAM OXALATE 10 MG PO TABS: ORAL_TABLET | ORAL | 3 refills | Status: AC

## 2024-05-20 MED ORDER — ESTRADIOL 10 MCG VA TABS: ORAL_TABLET | VAGINAL | 3 refills | Status: AC

## 2024-05-20 NOTE — Patient Instructions (Signed)

## 2024-05-21 LAB — CERVICOVAGINAL ANCILLARY ONLY
Bacterial Vaginitis (gardnerella): NEGATIVE
Candida Glabrata: NEGATIVE
Candida Vaginitis: NEGATIVE
Comment: NEGATIVE
Comment: NEGATIVE
Comment: NEGATIVE
Comment: NEGATIVE
Trichomonas: NEGATIVE

## 2024-05-22 ENCOUNTER — Ambulatory Visit: Payer: Self-pay | Admitting: Obstetrics and Gynecology

## 2024-06-08 ENCOUNTER — Ambulatory Visit: Payer: Medicare PPO | Admitting: Internal Medicine

## 2024-06-08 ENCOUNTER — Ambulatory Visit

## 2024-06-11 ENCOUNTER — Ambulatory Visit: Admitting: Family Medicine

## 2024-06-19 ENCOUNTER — Ambulatory Visit: Admitting: Family Medicine

## 2024-06-19 NOTE — Progress Notes (Unsigned)
 "     Office Note 06/19/2024  CC: No chief complaint on file.   HPI:  Morgan Harrison is a 69 y.o. female who is here to establish care, ***. Patient's most recent primary MD: [facility].  Also Dr. Cathlyn, GYN MD. Old records in [facility] EMR were reviewed prior to or during today's visit.  ***  Past Medical History:  Diagnosis Date   Abnormal Pap smear of cervix    just repeat done   Anxiety and depression    Fibroid    Osteopenia    Right radial fracture    distal-->ORIF 02/2024 ([facility])   Shingles     Past Surgical History:  Procedure Laterality Date   CESAREAN SECTION  1993   ENDOMETRIAL BIOPSY     x2   TONSILLECTOMY  1963   TUBAL LIGATION  1993   BTL    Family History  Problem Relation Age of Onset   Neuropathy Father        idiopathic    Social History   Socioeconomic History   Marital status: Married    Spouse name: Ted   Number of children: 3   Years of education: MA   Highest education level: Not on file  Occupational History    Employer: [employer]    Comment: [employer]  Tobacco Use   Smoking status: Never   Smokeless tobacco: Never  Substance and Sexual Activity   Alcohol use: No   Drug use: No   Sexual activity: Yes    Partners: Male    Birth control/protection: Surgical, Post-menopausal    Comment: btl, less than 5, after 17, 1 STD, no abnormal pap  Other Topics Concern   Not on file  Social History Narrative   Patient lives at home with her spouse.   Caffeine Use: 1 cup daily   Social Drivers of Health   Tobacco Use: Low Risk (05/20/2024)   Patient History    Smoking Tobacco Use: Never    Smokeless Tobacco Use: Never    Passive Exposure: Not on file  Financial Resource Strain: Not on file  Food Insecurity: Not on file  Transportation Needs: Not on file  Physical Activity: Not on file  Stress: Not on file  Social Connections: Unknown (10/16/2021)   [facility]    Social Network    Social Network: Not on file  Intimate Partner Violence: Unknown (09/07/2021)   Received from [facility]   HITS    Physically Hurt: Not on file    Insult or Talk Down To: Not on file    Threaten Physical Harm: Not on file    Scream or Curse: Not on file  Depression (PHQ2-9): Low Risk (06/11/2023)   Depression (PHQ2-9)    PHQ-2 Score: 0  Alcohol Screen: Not on file  Housing: Not on file  Utilities: Not on file  Health Literacy: Not on file    Outpatient Encounter Medications as of 06/19/2024  Medication Sig   aspirin 81 MG tablet Take 81 mg by mouth daily.   B Complex Vitamins (VITAMIN B COMPLEX PO) Take by mouth.   Cholecalciferol (VITAMIN D3 PO) Take 4,000 Int'l Units by mouth.   escitalopram  (LEXAPRO ) 10 MG tablet TAKE (1) TABLET BY MOUTH ONCE DAILY.   Estradiol  10 MCG TABS vaginal tablet PLACE 1 TABLET VAGINALLY 2 TIMES A WEEK AT BEDTIME.   MAGNESIUM PO Take by mouth.   naftifine (NAFTIN) 1 % cream Apply  topically daily. Naftifine hydrochloride 2%   rosuvastatin (CRESTOR) 5 MG tablet Take 1 tablet (5 mg total) by mouth once a week.   valACYclovir  (VALTREX ) 1000 MG tablet SMARTSIG:1 Tablet(s) By Mouth Every 12 Hours   No facility-administered encounter medications on file as of 06/19/2024.    Allergies[1]  Review of Systems *** PE; Last menstrual period 09/19/2011.  Physical Exam There is no height or weight on file to calculate BMI.  *** Pertinent labs:  Last CBC Lab Results  Component Value Date   WBC 5.6 06/04/2023   HGB 12.6 06/04/2023   HCT 39.9 06/04/2023   MCV 89 06/04/2023   MCH 27.9 06/04/2023   RDW 12.4 06/04/2023   PLT 314 06/04/2023   Last metabolic panel Lab Results  Component Value Date   GLUCOSE 82 06/04/2023   NA 136 06/04/2023   K 5.1 06/04/2023   CL 97 06/04/2023   CO2 22 06/04/2023   BUN 9 06/04/2023   CREATININE 0.62 06/04/2023   EGFR 98 06/04/2023   CALCIUM 9.7 06/04/2023   PROT 7.0 06/04/2023   ALBUMIN 4.5  06/04/2023   LABGLOB 2.5 06/04/2023   BILITOT 0.3 06/04/2023   ALKPHOS 90 06/04/2023   AST 20 06/04/2023   ALT 17 06/04/2023   ANIONGAP 8 06/30/2022   Last lipids Lab Results  Component Value Date   CHOL 151 06/04/2023   HDL 34 (L) 06/04/2023   LDLCALC 101 (H) 06/04/2023   TRIG 83 06/04/2023   CHOLHDL 4.4 06/04/2023   Last hemoglobin A1c Lab Results  Component Value Date   HGBA1C 5.6 06/04/2023   Last thyroid functions Lab Results  Component Value Date   TSH 1.890 06/04/2023   FREET4 1.23 06/04/2023   Last vitamin D  Lab Results  Component Value Date   VD25OH 57.2 06/04/2023   Last vitamin B12 and Folate Lab Results  Component Value Date   VITAMINB12 732 06/04/2023   FOLATE >20.0 06/04/2023   ASSESSMENT AND PLAN:   No problem-specific Assessment & Plan notes found for this encounter.  Health maintenance exam: Reviewed age and gender appropriate health maintenance issues (prudent diet, regular exercise, health risks of tobacco and excessive alcohol, use of seatbelts, fire alarms in home, use of sunscreen).  Also reviewed age and gender appropriate health screening as well as vaccine recommendations. Vaccines: Prevnar 20-->***.  Shingrix-->***.  Flu-->***. Labs: fasting HP + Hba1c (prediabetes). Cervical ca screening: per GYN MD. Breast ca screening: Mammogram normal 03/13/2024.  Anticipate repeat October 2026 (GYN MD). Colon ca screening: There is record of a colonoscopy from March 2011, normal.  *** Osteoporosis screening: DEXA ordered by GYN MD.  An After Visit Summary was printed and given to the patient.  No follow-ups on file.  Signed:  Gerlene Hockey, MD           06/19/2024      [1] No Known Allergies  "

## 2024-07-02 ENCOUNTER — Other Ambulatory Visit: Payer: Self-pay | Admitting: Internal Medicine

## 2024-07-02 DIAGNOSIS — E782 Mixed hyperlipidemia: Secondary | ICD-10-CM

## 2024-07-29 ENCOUNTER — Ambulatory Visit: Admitting: Family Medicine

## 2024-08-05 ENCOUNTER — Ambulatory Visit: Admitting: Family Medicine
